# Patient Record
Sex: Female | Born: 1980 | Race: White | Hispanic: No | Marital: Married | Smoking: Former smoker
Health system: Southern US, Community
[De-identification: ages and names within clinical notes are randomized; demographics above are authoritative.]

## PROBLEM LIST (undated history)

## (undated) DIAGNOSIS — Q211 Atrial septal defect, unspecified: Secondary | ICD-10-CM

## (undated) DIAGNOSIS — F329 Major depressive disorder, single episode, unspecified: Secondary | ICD-10-CM

## (undated) DIAGNOSIS — F32A Depression, unspecified: Secondary | ICD-10-CM

## (undated) HISTORY — DX: Depression, unspecified: F32.A

## (undated) HISTORY — DX: Major depressive disorder, single episode, unspecified: F32.9

## (undated) HISTORY — PX: OTHER SURGICAL HISTORY: SHX169

## (undated) HISTORY — PX: ASD REPAIR: SHX258

---

## 2006-11-02 HISTORY — PX: KNEE ARTHROSCOPY: SUR90

## 2008-08-09 ENCOUNTER — Ambulatory Visit: Payer: Self-pay | Admitting: Family Medicine

## 2008-08-09 DIAGNOSIS — R5381 Other malaise: Secondary | ICD-10-CM | POA: Insufficient documentation

## 2008-08-09 DIAGNOSIS — J309 Allergic rhinitis, unspecified: Secondary | ICD-10-CM | POA: Insufficient documentation

## 2008-08-09 DIAGNOSIS — F329 Major depressive disorder, single episode, unspecified: Secondary | ICD-10-CM | POA: Insufficient documentation

## 2008-08-09 DIAGNOSIS — R5383 Other fatigue: Secondary | ICD-10-CM

## 2008-08-10 LAB — CONVERTED CEMR LAB
ALT: 15 units/L (ref 0–35)
Albumin: 4.3 g/dL (ref 3.5–5.2)
BUN: 14 mg/dL (ref 6–23)
Basophils Relative: 0.7 % (ref 0.0–3.0)
CO2: 29 meq/L (ref 19–32)
Calcium: 10 mg/dL (ref 8.4–10.5)
Cholesterol: 198 mg/dL (ref 0–200)
Creatinine, Ser: 1 mg/dL (ref 0.4–1.2)
GFR calc Af Amer: 84 mL/min
Glucose, Bld: 91 mg/dL (ref 70–99)
HCT: 37.3 % (ref 36.0–46.0)
Hemoglobin: 12.7 g/dL (ref 12.0–15.0)
Monocytes Absolute: 0.4 10*3/uL (ref 0.1–1.0)
Monocytes Relative: 6.4 % (ref 3.0–12.0)
Neutro Abs: 4.7 10*3/uL (ref 1.4–7.7)
RBC: 4.47 M/uL (ref 3.87–5.11)
RDW: 12.6 % (ref 11.5–14.6)
Sodium: 138 meq/L (ref 135–145)
TSH: 0.91 microintl units/mL (ref 0.35–5.50)
Total CHOL/HDL Ratio: 2.8
Total Protein: 6.6 g/dL (ref 6.0–8.3)
Triglycerides: 107 mg/dL (ref 0–149)

## 2009-03-31 LAB — CONVERTED CEMR LAB: Pap Smear: NORMAL

## 2009-09-04 ENCOUNTER — Telehealth: Payer: Self-pay | Admitting: Family Medicine

## 2010-02-24 ENCOUNTER — Telehealth: Payer: Self-pay | Admitting: Family Medicine

## 2010-03-28 ENCOUNTER — Ambulatory Visit: Payer: Self-pay | Admitting: Family Medicine

## 2010-03-28 DIAGNOSIS — K219 Gastro-esophageal reflux disease without esophagitis: Secondary | ICD-10-CM | POA: Insufficient documentation

## 2010-12-02 NOTE — Assessment & Plan Note (Signed)
Summary: MED REFILLS/RI   Vital Signs:  Patient profile:   30 year old female Height:      64.25 inches Weight:      158.25 pounds BMI:     27.05 Temp:     98.3 degrees F oral Pulse rate:   80 / minute Pulse rhythm:   regular BP sitting:   120 / 84  (left arm) Cuff size:   regular  Vitals Entered By: Lewanda Rife LPN (Mar 28, 2010 4:02 PM) CC: discuss meds   History of Present Illness: doing pretty good overall   no major medicine changes   went into a winter funk  some increase in stress at work  husband was out of work for a while -- is stressful winter is hard on her  better when she can be outdoors  now finally feeling better   is outdoors kayaking and walking dogs lots of exercise does go to gym in winter 3-4 d a week   still on ortho tri cyclen    wt is down 9 lb   is up to date on TD   had a little reflux -- took prilosec - as needed - helped tremendously  she does take aleve regularly for shoulder pain and sport injuries   Allergies: 1)  ! Paxil  Past History:  Past Medical History: Last updated: 08/09/2008 depression  allergic rhinitis  knee problems   gyn- Marlinda Mike (Midwife)- [facility]  ortho-- Dr L. 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  works at [employer]  G0P0 Married Former Smoker (quit at 20 -- did not smoke long)  Alcohol use-yes Drug use-no Regular exercise-yes-- elliptical and bike (4-5 times per week)  Risk Factors: Exercise: yes (08/09/2008)  Risk Factors: Smoking Status: quit (08/09/2008)  Social  History: Occupation: Architectural technologist Engineer, 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 Eyes:  Denies blurring and eye irritation. CV:  Denies chest pain or discomfort, palpitations, and shortness of breath with exertion. Resp:  Denies cough and wheezing. GI:  Complains of indigestion; denies abdominal pain, nausea, and vomiting. GU:  Denies urinary frequency. MS:  Denies joint pain and muscle aches. Derm:  Denies itching, lesion(s), and rash. Neuro:  Denies tingling. Psych:  Complains of depression; denies panic attacks, sense of great danger, and suicidal thoughts/plans. Endo:  Denies excessive thirst and excessive urination.  Physical Exam  General:  Well-developed,well-nourished,in no acute distress; alert,appropriate and cooperative throughout examination Head:  normocephalic, atraumatic, and no abnormalities observed.   Eyes:  vision grossly intact, pupils equal, pupils round, and pupils reactive to light.   Mouth:  pharynx pink and moist.   Neck:  supple with full rom and no masses or thyromegally, no JVD or carotid bruit  Chest Wall:  No deformities, masses, or tenderness noted. Lungs:  Normal respiratory effort, chest expands symmetrically.  Lungs are clear to auscultation, no crackles or wheezes. Heart:  Normal rate and regular rhythm. S1 and S2 normal without gallop, murmur, click, rub or other extra sounds. Abdomen:  Bowel sounds positive,abdomen soft and non-tender without masses, organomegaly or hernias noted. Extremities:  No clubbing, cyanosis, edema, or deformity noted with normal full range of motion of all joints.   Neurologic:  sensation intact to light touch, gait normal, and DTRs symmetrical and normal.   Skin:   Intact without suspicious lesions or rashes Cervical Nodes:  No lymphadenopathy noted Inguinal Nodes:  No significant adenopathy Psych:  normal affect, talkative and pleasant    Impression & Recommendations:  Problem # 1:  DEPRESSION (ICD-311) Assessment Improved doing well after winter funk -- getting back to herself  for now will continue currrent dose of wellbutrin and will call if she thinks she needs to increase  Her updated medication list for this problem includes:    Wellbutrin Xl 150 Mg Xr24h-tab (Bupropion hcl) ..... One by mouth daily  Problem # 2:  GERD (ICD-530.81) Assessment: New mild and intermittent  urged to continue prilosec as needed - (unless symptoms become more persistant)  also to avoid nsaids like aleve unless necessary  Complete Medication List: 1)  Wellbutrin Xl 150 Mg Xr24h-tab (Bupropion hcl) .... One by mouth daily 2)  Ortho Tri-cyclen (28) 0.035 Mg Tabs (Norgestimate-ethinyl estradiol) .... One by mouth daily as directed 3)  Claritin 10 Mg Tabs (Loratadine) .... One by mouth dialy 4)  Glucosamine Tabs  .... Daily as needed 5)  Aleve 220 Mg Tabs (Naproxen sodium) .... Otc as directed.  Patient Instructions: 1)  continue current dose of wellbutrin -- update me if your depression worsens and if you want to increase dose  2)  use aleve with caution - it causes stomach ulcers  3)  keep the great exercise  Prescriptions: WELLBUTRIN XL 150 MG XR24H-TAB (BUPROPION HCL) one by mouth daily  #90 x 3   Entered and Authorized by:   Judith Part MD   Signed by:   Judith Part MD on 03/28/2010   Method used:   Print then Give to Patient   RxID:   [id number]   Current Allergies (reviewed today): ! PAXIL   Preventive Care Screening  Pap Smear:    Date:  03/31/2009    Results:  normal

## 2010-12-02 NOTE — Progress Notes (Signed)
Summary: Bupropion 150mg  refill  Phone Note Refill Request Call back at [phone] Message from:  [facility] on February 24, 2010 9:54 AM  Refills Requested: Medication #1:  WELLBUTRIN XL 150 MG XR24H-TAB one by mouth daily   Last Refilled: 01/25/2010 [facility] electronically request refill for Bupropion 150mg . Pt has not been seen since 08/09/2008.Please advise.    Method Requested: Telephone to Pharmacy Initial call taken by: Lewanda Rife LPN,  February 24, 2010 9:55 AM  Follow-up for Phone Call        needs to f/u this summer- no visit since 2009  sched appt  then can refil -px written on EMR for call in  Follow-up by: Judith Part MD,  February 24, 2010 12:48 PM  Additional Follow-up for Phone Call Additional follow up Details #1::        Patient notified as instructed by telephone. Pt scheduled appt with Dr Milinda Antis 03/28/10 at 3:30pm as instructed. Medication phoned to [facility] as instructed. Lewanda Rife LPN  February 24, 2010 4:36 PM     New/Updated Medications: WELLBUTRIN XL 150 MG XR24H-TAB (BUPROPION HCL) one by mouth daily Prescriptions: WELLBUTRIN XL 150 MG XR24H-TAB (BUPROPION HCL) one by mouth daily  #30 x 2   Entered and Authorized by:   Judith Part MD   Signed by:   Lewanda Rife LPN on 45/40/9811   Method used:   Telephoned to ...         RxID:   [id number]

## 2011-05-05 ENCOUNTER — Other Ambulatory Visit: Payer: Self-pay

## 2011-05-05 MED ORDER — BUPROPION HCL ER (XL) 150 MG PO TB24: 150.0000 mg | ORAL_TABLET | Freq: Every day | ORAL | Status: DC

## 2011-05-05 NOTE — Telephone Encounter (Signed)
[facility] pharmacy faxed refill request for Bupropion HCL XL 150 mg take one tablet by mouth daily/ #90 x 0. Pt already scheduled for appt with Dr Milinda Antis 05/20/11.

## 2011-05-05 NOTE — Telephone Encounter (Signed)
First time printed. Second time normal was highlighted and printed again. Rene Kocher said to call in to pharmacy and document. I called to [facility] [phone].

## 2011-05-09 ENCOUNTER — Encounter: Payer: Self-pay | Admitting: Family Medicine

## 2011-05-20 ENCOUNTER — Encounter: Payer: Self-pay | Admitting: Family Medicine

## 2011-05-20 ENCOUNTER — Ambulatory Visit (INDEPENDENT_AMBULATORY_CARE_PROVIDER_SITE_OTHER): Payer: PRIVATE HEALTH INSURANCE | Admitting: Family Medicine

## 2011-05-20 VITALS — BP 110/68 | HR 76 | Temp 98.8°F | Ht 64.25 in | Wt 158.5 lb

## 2011-05-20 DIAGNOSIS — F411 Generalized anxiety disorder: Secondary | ICD-10-CM

## 2011-05-20 DIAGNOSIS — F419 Anxiety disorder, unspecified: Secondary | ICD-10-CM | POA: Insufficient documentation

## 2011-05-20 MED ORDER — SERTRALINE HCL 50 MG PO TABS: 50.0000 mg | ORAL_TABLET | Freq: Every day | ORAL | Status: DC

## 2011-05-20 MED ORDER — BUPROPION HCL ER (XL) 150 MG PO TB24: 150.0000 mg | ORAL_TABLET | Freq: Every day | ORAL | Status: DC

## 2011-05-20 NOTE — Assessment & Plan Note (Addendum)
Disc stressors /coping mech/ sympt and opt for tx in detail Added zoloft 25-titrate to 50  Disc poss side eff incl sedation (may help sleep) and also rare poss of worsening  Will update if any problems  Follow up 6-8 wk >25 min spent with face to face with patient, >50% counseling and/or coordinating care   Will wean wellbutrin if able and doing well on this since it tx both anx and depression

## 2011-05-20 NOTE — Patient Instructions (Signed)
Seek counseling at work  Keep exercising Add zoloft 1/2 tab in evening for 2 weeks then increase to 1 pill each evening  If worse or side effects let me know  Follow up in 6-8 weeks

## 2011-05-20 NOTE — Progress Notes (Signed)
Subjective:    Patient ID: Natalie Lowe, female    DOB: 1980/11/26, 30 y.o.   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 DEPRESSION  . ALLERGIC RHINITIS  . GERD  . FATIGUE  . Anxiety   Past Medical History  Diagnosis Date  . Depression   . Allergy     allergic rhinitis   Past Surgical History  Procedure Date  . Knee arthroscopy 2008    right  . Oates procedure 94-97    seven knee signs for torn ACL,menicusal tear   History  Substance Use Topics  . Smoking status: Former Smoker    Quit date: 11/02/2000  . Smokeless tobacco: Not on file  . Alcohol Use: Yes   Family History  Problem Relation Age of Onset  . Hypertension Mother   . Cancer Maternal Aunt     breast CA   Allergies  Allergen Reactions  . Paroxetine     REACTION: not effective   Current Outpatient Prescriptions on File Prior to Visit  Medication Sig Dispense Refill  . loratadine (CLARITIN)  10 MG tablet Take 10 mg by mouth daily.        Lorita Officer Triphasic (ORTHO TRI-CYCLEN, 28, PO) Take 1 tablet by mouth daily.        Marland Kitchen GLUCOSAMINE PO Take by mouth as needed.        . naproxen sodium (ALEVE) 220 MG tablet Take 220 mg by mouth as directed.              Review of Systems Review of Systems  Constitutional: Negative for fever, appetite change, fatigue and unexpected weight change.  Eyes: Negative for pain and visual disturbance.  Respiratory: Negative for cough and shortness of breath.   Cardiovascular: Negative.for cp or sob or palpitations   Gastrointestinal: Negative for nausea, diarrhea and constipation.  Genitourinary: Negative for urgency and frequency.  Skin: Negative for pallor.  Neurological: Negative for weakness, light-headedness, numbness and headaches.  Hematological: Negative for adenopathy. Does not bruise/bleed easily.  Psychiatric/Behavioral: more anxiety than dysphoric mood lately, no SI        Objective:   Physical Exam  Constitutional: She appears well-developed and well-nourished. No distress.  HENT:  Head: Normocephalic and atraumatic.  Left Ear: External ear normal.  Mouth/Throat: Oropharynx is clear and moist.  Eyes: Conjunctivae and EOM are normal. Pupils are equal, round, and reactive to light.  Neck: Normal range of motion. Neck supple. No JVD present. Carotid bruit is not present. No thyromegaly present.  Cardiovascular: Normal rate, regular rhythm, normal heart sounds and intact distal pulses.   Pulmonary/Chest: Effort normal and breath sounds normal. No respiratory distress. She has no wheezes.  Abdominal: Soft. Bowel sounds are normal.  Musculoskeletal: She exhibits no edema and no tenderness.  Lymphadenopathy:    She has no cervical adenopathy.  Neurological: She is alert. She has normal reflexes.       No tremor   Skin: Skin is warm and dry. No rash noted. No erythema. No pallor.  Psychiatric: She has a normal mood  and affect.       Seems mildly anxious - rapid speech at times No eye contact and comm skills Pleasant overall           Assessment & Plan:

## 2011-07-15 ENCOUNTER — Ambulatory Visit (INDEPENDENT_AMBULATORY_CARE_PROVIDER_SITE_OTHER): Payer: PRIVATE HEALTH INSURANCE | Admitting: Family Medicine

## 2011-07-15 ENCOUNTER — Encounter: Payer: Self-pay | Admitting: Family Medicine

## 2011-07-15 DIAGNOSIS — F411 Generalized anxiety disorder: Secondary | ICD-10-CM

## 2011-07-15 DIAGNOSIS — F419 Anxiety disorder, unspecified: Secondary | ICD-10-CM

## 2011-07-15 DIAGNOSIS — F3289 Other specified depressive episodes: Secondary | ICD-10-CM

## 2011-07-15 DIAGNOSIS — F329 Major depressive disorder, single episode, unspecified: Secondary | ICD-10-CM

## 2011-07-15 NOTE — Progress Notes (Signed)
Subjective:    Patient ID: Natalie Lowe, female    DOB: Mar 14, 1981, 30 y.o.   MRN: [id number]  HPI Here for f/u of anxiety  Last visit added zoloft to her wellbutrin  Is doing so much better - even her husband has noticed Thinks about stress but it does not eat at her  More laid back - not ahead of herself  Able to feel more joy and laughs more   Does feel a little more tired - not too bad though -- was worse initially Does not make a difference what time she takes it  Is sleeping much better too  Does not wake up  Appetite the same   Wants to stay on zoloft and get off of wellbutrin (does not think she needs both)  Patient Active Problem List  Diagnoses  . DEPRESSION  . ALLERGIC RHINITIS  . GERD  . Anxiety   Past Medical History  Diagnosis Date  . Depression   . Allergy     allergic rhinitis   Past Surgical History  Procedure Date  . Knee arthroscopy 2008    right  . Oates procedure 94-97    seven knee signs for torn ACL,menicusal tear   History  Substance Use Topics  . Smoking status: Former Smoker    Quit date: 11/02/2000  . Smokeless tobacco: Not on file  . Alcohol Use: Yes   Family History  Problem Relation Age of Onset  . Hypertension Mother   . Cancer Maternal Aunt     breast CA   Allergies  Allergen Reactions  . Paroxetine     REACTION: not effective   Current Outpatient Prescriptions on File Prior to Visit  Medication Sig Dispense Refill  . Norgestim-Eth Estrad Triphasic (ORTHO TRI-CYCLEN, 28, PO) Take 1 tablet by mouth daily.        Marland Kitchen omeprazole (PRILOSEC OTC) 20 MG tablet Take 20 mg by mouth daily.        . sertraline (ZOLOFT) 50 MG tablet Take 1 tablet (50 mg total) by mouth daily.  90 tablet  3  . GLUCOSAMINE PO Take by mouth as needed.        Marland Kitchen ibuprofen (ADVIL,MOTRIN) 200 MG tablet Take 200 mg by mouth every 6 (six) hours as needed.        . loratadine (CLARITIN) 10 MG tablet Take 10 mg by mouth daily.        . naproxen sodium (ALEVE)  220 MG tablet Take 220 mg by mouth as directed.           Review of Systems Review of Systems  Constitutional: Negative for fever, appetite change, fatigue and unexpected weight change.  Eyes: Negative for pain and visual disturbance.  Respiratory: Negative for cough and shortness of breath.   Cardiovascular: Negative for cp or palpitations    Gastrointestinal: Negative for nausea, diarrhea and constipation.  Genitourinary: Negative for urgency and frequency.  Skin: Negative for pallor or rash   Neurological: Negative for weakness, light-headedness, numbness and headaches.  Hematological: Negative for adenopathy. Does not bruise/bleed easily.  Psychiatric/Behavioral: Negative for dysphoric mood. The patient is much less anxious, and no SI          Objective:   Physical Exam  Constitutional: She appears well-developed and well-nourished. No distress.  HENT:  Head: Normocephalic and atraumatic.  Mouth/Throat: Oropharynx is clear and moist.  Eyes: Conjunctivae and EOM are normal. Pupils are equal, round, and reactive to light.  Neck: Normal range of  motion. Neck supple. No JVD present. No thyromegaly present.  Cardiovascular: Normal rate, regular rhythm, normal heart sounds and intact distal pulses.   Pulmonary/Chest: Effort normal and breath sounds normal. No respiratory distress. She has no wheezes.  Lymphadenopathy:    She has no cervical adenopathy.  Neurological: She is alert. She has normal reflexes. No cranial nerve deficit. Coordination normal.       No tremor   Skin: Skin is warm and dry. No pallor.  Psychiatric:       Relaxed affect, no fast or pressured speech Animated and happy appearing  Good eye contact and communication skills           Assessment & Plan:

## 2011-07-15 NOTE — Assessment & Plan Note (Signed)
This is much improved with zoloft so wants to continue it  For now - will try getting off the wellbutrin - and re start if any problems or rebound symptoms Pt no longer thinks that depression is the issue Disc coping skills F/u 6 mo

## 2011-07-15 NOTE — Assessment & Plan Note (Signed)
Overall doing much better with zoloft and control of anx so will stop the wellbutrin Pt aware to call if any problems or symptoms return

## 2011-07-15 NOTE — Patient Instructions (Signed)
Continue the zoloft  Stop the wellbutrin  If you feel bad/ depressed/worse- call and let me know  Keep up good health habits and try to exercise  Follow up in about 6 months

## 2011-11-10 ENCOUNTER — Telehealth: Payer: Self-pay | Admitting: Internal Medicine

## 2011-11-10 NOTE — Telephone Encounter (Signed)
Have her f/u with me please for heart M and also palpitations- thanks

## 2011-11-10 NOTE — Telephone Encounter (Signed)
Left vm for pt to callback 

## 2011-11-10 NOTE — Telephone Encounter (Signed)
Patient notified as instructed by telephone. Pt has already made appt for 11/18/11 at 4pm. Pt will call back if symptoms worsen.

## 2011-11-10 NOTE — Telephone Encounter (Signed)
PA at work diagnosed her with a heart murmur and she is concerned about it because her mom had a heart murmur and come to find out she had a congential defect that required surgery and she didn't know if you could refer her to someone or she needed to come and see you first.  Over the past 1 year she has had heart palpitations and it lasted 5 to 10 minutes.  But she exercises regularly and none recently she is just concerned with her family history.  Please advise.

## 2011-11-18 ENCOUNTER — Ambulatory Visit (INDEPENDENT_AMBULATORY_CARE_PROVIDER_SITE_OTHER): Payer: PRIVATE HEALTH INSURANCE | Admitting: Family Medicine

## 2011-11-18 ENCOUNTER — Encounter: Payer: Self-pay | Admitting: Family Medicine

## 2011-11-18 VITALS — BP 108/70 | HR 72 | Temp 98.4°F | Ht 64.25 in | Wt 166.0 lb

## 2011-11-18 DIAGNOSIS — F419 Anxiety disorder, unspecified: Secondary | ICD-10-CM

## 2011-11-18 DIAGNOSIS — R002 Palpitations: Secondary | ICD-10-CM

## 2011-11-18 DIAGNOSIS — F411 Generalized anxiety disorder: Secondary | ICD-10-CM

## 2011-11-18 DIAGNOSIS — Z8249 Family history of ischemic heart disease and other diseases of the circulatory system: Secondary | ICD-10-CM | POA: Insufficient documentation

## 2011-11-18 DIAGNOSIS — R011 Cardiac murmur, unspecified: Secondary | ICD-10-CM | POA: Insufficient documentation

## 2011-11-18 MED ORDER — FLUOXETINE HCL 20 MG PO TABS: 20.0000 mg | ORAL_TABLET | Freq: Every day | ORAL | Status: DC

## 2011-11-18 NOTE — Progress Notes (Signed)
Subjective:    Patient ID: Natalie Lowe, female    DOB: 08/20/1981, 31 y.o.   MRN: [id number]  HPI Here for f/u of palpitations and possible heart murmur and also question about wt gain rel to anx med  A PA at work - employee clinic and told she had a M   (recommended baseline echo)  Once an orthopedic doc told her the same thing   Has palpitations -- occ / this past year 3 times (last 4-5 months )- lasted 5-20 minutes max  Infrequent irregular beats, NOT a racing heart  No dizziness or syncope Works out 3-4 days per week     Pt's mother had a congenital heart defect that required surgery Thinks she had an atrial septal defect - had surgery in her mid 48s (after passing out )  Patent foramen ovale? Possible    EKG today RRR 72, no acute changes  Has RSR in V1  occ a bit anemic - donates blood  Hb over 12 in dec   Has put on 10 lb recently -- zoloft for few months Wants to try prozac instead   Patient Active Problem List  Diagnoses  . DEPRESSION  . ALLERGIC RHINITIS  . GERD  . Anxiety  . Heart murmur  . Family history of heart murmur   Past Medical History  Diagnosis Date  . Depression   . Allergy     allergic rhinitis   Past Surgical History  Procedure Date  . Knee arthroscopy 2008    right  . Oates procedure 94-97    seven knee signs for torn ACL,menicusal tear   History  Substance Use Topics  . Smoking status: Former Smoker    Quit date: 11/02/2000  . Smokeless tobacco: Not on file  . Alcohol Use: Yes   Family History  Problem Relation Age of Onset  . Hypertension Mother   . Cancer Maternal Aunt     breast CA   Allergies  Allergen Reactions  . Paroxetine     REACTION: not effective   Current Outpatient Prescriptions on File Prior to Visit  Medication Sig Dispense Refill  . fluticasone (FLONASE) 50 MCG/ACT nasal spray Place 2 sprays into the nose daily.        Marland Kitchen ibuprofen (ADVIL,MOTRIN) 200 MG tablet Take 200 mg by mouth every 6 (six) hours as  needed.        . loratadine (CLARITIN) 10 MG tablet Take 10 mg by mouth daily.        Lorita Officer Triphasic (ORTHO TRI-CYCLEN, 28, PO) Take 1 tablet by mouth daily.        Marland Kitchen omeprazole (PRILOSEC OTC) 20 MG tablet Take 20 mg by mouth daily.        Marland Kitchen Fexofenadine HCl (ALLEGRA PO) Take by mouth as directed.        Marland Kitchen GLUCOSAMINE PO Take by mouth as needed.        . naproxen sodium (ALEVE) 220 MG tablet Take 220 mg by mouth as directed.             Review of Systems Review of Systems  Constitutional: Negative for fever, appetite change, fatigue and pos for wt gain Eyes: Negative for pain and visual disturbance.  Respiratory: Negative for cough and shortness of breath.   Cardiovascular: Negative for cp or sob on exertion or edema, pos for occ palpitations  Gastrointestinal: Negative for nausea, diarrhea and constipation.  Genitourinary: Negative for urgency and frequency.  Skin: Negative  for pallor or rash   Neurological: Negative for weakness, light-headedness, numbness and headaches.  Hematological: Negative for adenopathy. Does not bruise/bleed easily.  Psychiatric/Behavioral: Negative for dysphoric mood. The patient's anxiety is in good control.          Objective:   Physical Exam  Constitutional: She appears well-developed and well-nourished. No distress.  HENT:  Head: Normocephalic and atraumatic.  Mouth/Throat: Oropharynx is clear and moist.  Eyes: Conjunctivae and EOM are normal. Pupils are equal, round, and reactive to light. No scleral icterus.  Neck: Normal range of motion. Neck supple. No JVD present. Carotid bruit is not present. No thyromegaly present.  Cardiovascular: Normal rate, regular rhythm and intact distal pulses.  Exam reveals no gallop and no friction rub.   Murmur heard.      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 She exhibits no edema.  Lymphadenopathy:    She has no cervical adenopathy.    Neurological: She is alert. She has normal reflexes. She displays no tremor. No cranial nerve deficit. She exhibits normal muscle tone. Coordination normal.  Skin: Skin is warm and dry. No rash noted. No erythema. No pallor.  Psychiatric: She has a normal mood and affect.       Cheerful and talkative          Assessment & Plan:

## 2011-11-18 NOTE — Assessment & Plan Note (Signed)
Systolic M heard best at LSB  occ palpitations but no other symptoms at all- even during athletics Adv to stop caffeine Scheduled 2D echo

## 2011-11-18 NOTE — Assessment & Plan Note (Signed)
Doing well but gaining wt on zoloft Will change to prozac and update Rev poss side eff - if worse will call or if not as effective

## 2011-11-18 NOTE — Patient Instructions (Signed)
We will set you up for echocardiogram at check out  Avoid caffeine as much as possible  Will update you with results  Change zoloft to prozac - update me if any problems

## 2011-11-18 NOTE — Assessment & Plan Note (Signed)
Mother had ? 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

## 2011-11-24 ENCOUNTER — Ambulatory Visit: Payer: Self-pay | Admitting: Family Medicine

## 2011-11-24 DIAGNOSIS — I059 Rheumatic mitral valve disease, unspecified: Secondary | ICD-10-CM

## 2011-12-03 ENCOUNTER — Ambulatory Visit: Payer: Self-pay | Admitting: Cardiovascular Disease

## 2011-12-03 DIAGNOSIS — I517 Cardiomegaly: Secondary | ICD-10-CM

## 2011-12-04 ENCOUNTER — Telehealth: Payer: Self-pay | Admitting: *Deleted

## 2011-12-04 NOTE — Telephone Encounter (Signed)
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 She understands recovery instructions, but wants to know what she can do at the gym, any limitations to getting her HR up, etc. She was told Dr. Windell Hummingbird office would call with detailed instructions for this. I told her to take it easy over weekend and will send msg to Dr. Mariah Milling.

## 2011-12-04 NOTE — Telephone Encounter (Signed)
Spoke to pt, notified per Dr. Mariah Milling that she is scheduled for appt with Dr. Excell Seltzer in [facility] for Feb 7 for consult for ASD closure and she knows pictures will be sent there and also will have pics here in our office next week if she wants to pick up.

## 2011-12-10 ENCOUNTER — Ambulatory Visit (INDEPENDENT_AMBULATORY_CARE_PROVIDER_SITE_OTHER): Payer: PRIVATE HEALTH INSURANCE | Admitting: Cardiovascular Disease

## 2011-12-10 ENCOUNTER — Other Ambulatory Visit: Payer: Self-pay | Admitting: Cardiovascular Disease

## 2011-12-10 ENCOUNTER — Encounter: Payer: Self-pay | Admitting: Cardiovascular Disease

## 2011-12-10 VITALS — BP 106/54 | HR 75 | Ht 64.0 in | Wt 160.0 lb

## 2011-12-10 DIAGNOSIS — Q211 Atrial septal defect, unspecified: Secondary | ICD-10-CM | POA: Insufficient documentation

## 2011-12-10 MED ORDER — CLOPIDOGREL BISULFATE 75 MG PO TABS: 75.0000 mg | ORAL_TABLET | Freq: Every day | ORAL | Status: DC

## 2011-12-10 NOTE — Progress Notes (Signed)
HPI:  Natalie Lowe is a delightful 31-year-old woman presenting for evaluation of ASD. The patient was diagnosed with a heart murmur and underwent a screening echocardiogram. This demonstrated findings consistent with an atrial septal defect and right heart enlargement. She underwent a transesophageal echo by Dr. Gollan and this confirmed a fairly large secundum ASD. In some views there appears to be continuity of tissue but in other views there is clear discontinuity of tissue in the atrial septum.  The patient complains of shortness of breath, sometimes at rest and sometimes with exertion. She has no orthopnea, PND, or edema. She has rare palpitations. She has no chest pain.  The patient has no history of stroke or TIA.  Her family history is significant in that her mother had an ASD and underwent surgical closure.  Outpatient Encounter Prescriptions as of 12/10/2011  Medication Sig Dispense Refill  . aspirin 81 MG tablet Take 160 mg by mouth daily.      . FLUoxetine (PROZAC) 20 MG tablet Take 1 tablet (20 mg total) by mouth daily.  30 tablet  11  . fluticasone (FLONASE) 50 MCG/ACT nasal spray Place 2 sprays into the nose daily.        . GLUCOSAMINE PO Take by mouth as needed.        . ibuprofen (ADVIL,MOTRIN) 200 MG tablet Take 200 mg by mouth every 6 (six) hours as needed.        . loratadine (CLARITIN) 10 MG tablet Take 10 mg by mouth daily.        . naproxen sodium (ALEVE) 220 MG tablet Take 220 mg by mouth as directed.        . omeprazole (PRILOSEC OTC) 20 MG tablet Take 20 mg by mouth daily.        . progesterone (PROMETRIUM) 100 MG capsule Take 100 mg by mouth daily.      . clopidogrel (PLAVIX) 75 MG tablet Take 1 tablet (75 mg total) by mouth daily.  30 tablet  11  . DISCONTD: Fexofenadine HCl (ALLEGRA PO) Take by mouth as directed.        . DISCONTD: Norgestim-Eth Estrad Triphasic (ORTHO TRI-CYCLEN, 28, PO) Take 1 tablet by mouth daily.          Paroxetine  Past Medical History    Diagnosis Date  . Depression   . Allergy     allergic rhinitis    Past Surgical History  Procedure Date  . Knee arthroscopy 2008    right  . Oates procedure 94-97    seven knee signs for torn ACL,menicusal tear    History   Social History  . Marital Status: Married    Spouse Name: N/A    Number of Children: N/A  . Years of Education: N/A   Occupational History  . Not on file.   Social History Main Topics  . Smoking status: Former Smoker    Quit date: 11/02/2000  . Smokeless tobacco: Not on file  . Alcohol Use: Yes  . Drug Use: No  . Sexually Active:    Other Topics Concern  . Not on file   Social History Narrative  . No narrative on file    Family History  Problem Relation Age of Onset  . Hypertension Mother   . Cancer Maternal Aunt     breast CA    ROS: General: no fevers/chills/night sweats Eyes: no blurry vision, diplopia, or amaurosis ENT: no sore throat or hearing loss Resp: no cough, wheezing, or   hemoptysis CV: no edema or palpitations GI: no abdominal pain, nausea, vomiting, diarrhea, or constipation GU: no dysuria, frequency, or hematuria Skin: no rash Neuro: no headache, numbness, tingling, or weakness of extremities Musculoskeletal: no joint pain or swelling Heme: no bleeding, DVT, or easy bruising Endo: no polydipsia or polyuria  BP 106/54  Pulse 75  Ht 5' 4" (1.626 m)  Wt 72.576 kg (160 lb)  BMI 27.46 kg/m2  PHYSICAL EXAM: Pt is alert and oriented, WD, WN, in no distress. HEENT: normal Neck: JVP normal. Carotid upstrokes normal without bruits. No thyromegaly. Lungs: equal expansion, clear bilaterally CV: Apex is discrete and nondisplaced, RRR with grade 2/6 systolic murmur at the left sternal border Abd: soft, NT, +BS, no bruit, no hepatosplenomegaly Back: no CVA tenderness Ext: no C/C/E        Femoral pulses 2+= without bruits        DP/PT pulses intact and = Skin: warm and dry without rash Neuro: CNII-XII intact              Strength intact = bilaterally  EKG:  Normal sinus rhythm with an RSR prime pattern in V1.  ASSESSMENT AND PLAN:  

## 2011-12-10 NOTE — Assessment & Plan Note (Signed)
I have reviewed the patient's 2-D echo and TEE images. She has a large secundum ASD with left to right shunt and mild dilatation of the right atrium and right ventricle. She is symptomatic with shortness of breath. I think she meets clear indication for closure of her defect. We reviewed the potential treatment options of surgical closure versus transcatheter closure. I think her defect is anatomically suitable for transcatheter closure. We discussed the risks, alternatives, and indication for the procedure. Risks include but are not limited to bleeding, infection, stroke, myocardial infarction, device embolization, myocardial perforation, Tamponade, emergency surgery, and death. We also discussed the late risk of device erosion. She understands these risks are low, occurring approximately 1% of the time.  The patient and her husband asked appropriate questions, and would like to move forward with the procedure. We will start her on Plavix which she should continue for at least 3 months after device closure. She understands that she will have to use SBE prophylaxis for 6 months.

## 2011-12-10 NOTE — Patient Instructions (Addendum)
Dr Excell Seltzer has recommended ASD closure.   Start Plavix 75mg  on 12/23/11 take one by mouth daily.

## 2011-12-14 ENCOUNTER — Other Ambulatory Visit: Payer: Self-pay | Admitting: Physician Assistant

## 2011-12-16 ENCOUNTER — Encounter (HOSPITAL_COMMUNITY): Payer: Self-pay | Admitting: Pharmacy Technician

## 2011-12-24 ENCOUNTER — Telehealth: Payer: Self-pay | Admitting: Cardiovascular Disease

## 2011-12-24 NOTE — Telephone Encounter (Signed)
New msg Pt wants to know about pre authorization for procedure next week Please call

## 2011-12-24 NOTE — Telephone Encounter (Signed)
Spoke with pt, according to charmaine the pre-cert person in our office, she is waiting for pre-determination call back. The pt also wants to make sure dr cooper got her FMLA paperwork. Will have Lauren call and confirm with the pt regarding paperwork.

## 2011-12-25 NOTE — Telephone Encounter (Signed)
Pt aware of change in arrival time for procedure. I made the pt aware that we did receive her FMLA forms.  The pt said these need to be faxed back prior to 12/31/11.  I will make Dr Excell Seltzer aware.

## 2011-12-25 NOTE — Telephone Encounter (Signed)
Follow up on previous call;  Patient calling back to speak with nurse.

## 2011-12-25 NOTE — Telephone Encounter (Signed)
The time for pt's ASD closure has been changed to 1:30.  The pt should arrive at 11:30.  The pt can have clear liquids until 7:30 am and then NPO.  FMLA papers are in MD folder for completion.  I left a message for the pt to call back.

## 2011-12-28 NOTE — Telephone Encounter (Signed)
Form filled out and placed in  folder.

## 2011-12-30 ENCOUNTER — Telehealth: Payer: Self-pay | Admitting: Cardiovascular Disease

## 2011-12-30 NOTE — Telephone Encounter (Signed)
Pt calling re question she has about procedure she's having tomorrow, pls call

## 2011-12-30 NOTE — Telephone Encounter (Signed)
Pt reminded of report time for procedure.

## 2011-12-31 ENCOUNTER — Ambulatory Visit (HOSPITAL_COMMUNITY)
Admission: RE | Admit: 2011-12-31 | Discharge: 2012-01-01 | Disposition: A | Discharge status: Home / Self Care | Payer: PRIVATE HEALTH INSURANCE | Source: Ambulatory Visit | Attending: Cardiovascular Disease | Admitting: Cardiovascular Disease

## 2011-12-31 ENCOUNTER — Encounter (HOSPITAL_COMMUNITY)
Admission: RE | Disposition: A | Discharge status: Home / Self Care | Payer: Self-pay | Source: Ambulatory Visit | Attending: Cardiovascular Disease

## 2011-12-31 DIAGNOSIS — F329 Major depressive disorder, single episode, unspecified: Secondary | ICD-10-CM | POA: Insufficient documentation

## 2011-12-31 DIAGNOSIS — Q211 Atrial septal defect, unspecified: Secondary | ICD-10-CM | POA: Insufficient documentation

## 2011-12-31 DIAGNOSIS — Q2111 Secundum atrial septal defect: Secondary | ICD-10-CM | POA: Insufficient documentation

## 2011-12-31 DIAGNOSIS — F3289 Other specified depressive episodes: Secondary | ICD-10-CM | POA: Insufficient documentation

## 2011-12-31 DIAGNOSIS — J309 Allergic rhinitis, unspecified: Secondary | ICD-10-CM | POA: Insufficient documentation

## 2011-12-31 DIAGNOSIS — R0602 Shortness of breath: Secondary | ICD-10-CM | POA: Insufficient documentation

## 2011-12-31 HISTORY — DX: Atrial septal defect: Q21.1

## 2011-12-31 HISTORY — PX: PATENT FORAMEN OVALE CLOSURE: SHX5483

## 2011-12-31 HISTORY — DX: Atrial septal defect, unspecified: Q21.10

## 2011-12-31 LAB — PROTIME-INR
INR: 0.96 (ref 0.00–1.49)
Prothrombin Time: 13 seconds (ref 11.6–15.2)

## 2011-12-31 LAB — BASIC METABOLIC PANEL
Chloride: 106 mEq/L (ref 96–112)
GFR calc Af Amer: >90 mL/min (ref >90)
GFR calc non Af Amer: >90 mL/min (ref >90)
Potassium: 4.4 mEq/L (ref 3.5–5.1)
Sodium: 141 mEq/L (ref 135–145)

## 2011-12-31 LAB — APTT: aPTT: 28 seconds (ref 24–37)

## 2011-12-31 LAB — CBC
Platelets: 244 10*3/uL (ref 150–400)
RBC: 4.53 MIL/uL (ref 3.87–5.11)
RDW: 14.1 % (ref 11.5–15.5)
WBC: 4.8 10*3/uL (ref 4.0–10.5)

## 2011-12-31 LAB — HCG, SERUM, QUALITATIVE: Preg, Serum: NEGATIVE

## 2011-12-31 LAB — POCT ACTIVATED CLOTTING TIME: Activated Clotting Time: 243 seconds

## 2011-12-31 SURGERY — PATENT FORAMEN OVALE CLOSURE
Anesthesia: LOCAL

## 2011-12-31 MED ORDER — NORETHINDRONE 0.35 MG PO TABS: 1.0000 | ORAL_TABLET | Freq: Every day | ORAL | Status: DC

## 2011-12-31 MED ORDER — MIDAZOLAM HCL 2 MG/2ML IJ SOLN
INTRAMUSCULAR | Status: AC
  Filled 2011-12-31: qty 2

## 2011-12-31 MED ORDER — DIAZEPAM 5 MG PO TABS: 5.0000 mg | ORAL_TABLET | ORAL | Status: DC

## 2011-12-31 MED ORDER — SODIUM CHLORIDE 0.9 % IV SOLN: INTRAVENOUS | Status: DC

## 2011-12-31 MED ORDER — FENTANYL CITRATE 0.05 MG/ML IJ SOLN
INTRAMUSCULAR | Status: AC
  Filled 2011-12-31: qty 2

## 2011-12-31 MED ORDER — ASPIRIN 81 MG PO CHEW
CHEWABLE_TABLET | ORAL | Status: AC
  Filled 2011-12-31: qty 4

## 2011-12-31 MED ORDER — HEPARIN SODIUM (PORCINE) 1000 UNIT/ML IJ SOLN
INTRAMUSCULAR | Status: AC
  Filled 2011-12-31: qty 1

## 2011-12-31 MED ORDER — SODIUM CHLORIDE 0.9 % IJ SOLN: 3.0000 mL | INTRAMUSCULAR | Status: DC | PRN

## 2011-12-31 MED ORDER — ASPIRIN 81 MG PO CHEW
324.0000 mg | CHEWABLE_TABLET | ORAL | Status: AC
  Administered 2011-12-31: 324 mg via ORAL

## 2011-12-31 MED ORDER — FLUOXETINE HCL 20 MG PO CAPS
20.0000 mg | ORAL_CAPSULE | Freq: Every day | ORAL | Status: DC
  Administered 2012-01-01: 20 mg via ORAL
  Filled 2011-12-31 (×2): qty 1

## 2011-12-31 MED ORDER — SODIUM CHLORIDE 0.9 % IJ SOLN: 3.0000 mL | Freq: Two times a day (BID) | INTRAMUSCULAR | Status: DC

## 2011-12-31 MED ORDER — PANTOPRAZOLE SODIUM 40 MG PO TBEC
40.0000 mg | DELAYED_RELEASE_TABLET | Freq: Every day | ORAL | Status: DC
  Administered 2012-01-01: 40 mg via ORAL
  Filled 2011-12-31: qty 1

## 2011-12-31 MED ORDER — CEFAZOLIN SODIUM 1-5 GM-% IV SOLN: 1.0000 g | Freq: Once | INTRAVENOUS | Status: DC

## 2011-12-31 MED ORDER — FLUTICASONE PROPIONATE 50 MCG/ACT NA SUSP
2.0000 | Freq: Every day | NASAL | Status: DC
  Filled 2011-12-31: qty 16

## 2011-12-31 MED ORDER — SODIUM CHLORIDE 0.9 % IV SOLN
250.0000 mL | INTRAVENOUS | Status: DC | PRN
  Administered 2011-12-31: 100 mL via INTRAVENOUS

## 2011-12-31 MED ORDER — FLUOXETINE HCL 20 MG PO TABS: 20.0000 mg | ORAL_TABLET | Freq: Every day | ORAL | Status: DC

## 2011-12-31 MED ORDER — OMEPRAZOLE MAGNESIUM 20 MG PO TBEC: 20.0000 mg | DELAYED_RELEASE_TABLET | Freq: Every day | ORAL | Status: DC

## 2011-12-31 MED ORDER — SODIUM CHLORIDE 0.9 % IV SOLN: 250.0000 mL | INTRAVENOUS | Status: DC

## 2011-12-31 MED ORDER — CLOPIDOGREL BISULFATE 75 MG PO TABS: 75.0000 mg | ORAL_TABLET | ORAL | Status: DC

## 2011-12-31 MED ORDER — ONDANSETRON HCL 4 MG/2ML IJ SOLN: 4.0000 mg | Freq: Four times a day (QID) | INTRAMUSCULAR | Status: DC | PRN

## 2011-12-31 MED ORDER — LIDOCAINE HCL (PF) 1 % IJ SOLN
INTRAMUSCULAR | Status: AC
  Filled 2011-12-31: qty 30

## 2011-12-31 MED ORDER — HEPARIN (PORCINE) IN NACL 2-0.9 UNIT/ML-% IJ SOLN
INTRAMUSCULAR | Status: AC
  Filled 2011-12-31: qty 2000

## 2011-12-31 MED ORDER — SODIUM CHLORIDE 0.9 % IJ SOLN
3.0000 mL | Freq: Two times a day (BID) | INTRAMUSCULAR | Status: DC
  Administered 2011-12-31 – 2012-01-01 (×2): 3 mL via INTRAVENOUS

## 2011-12-31 MED ORDER — ASPIRIN 81 MG PO CHEW
81.0000 mg | CHEWABLE_TABLET | Freq: Every day | ORAL | Status: DC
  Administered 2012-01-01: 81 mg via ORAL
  Filled 2011-12-31: qty 1

## 2011-12-31 MED ORDER — SODIUM CHLORIDE 0.9 % IV SOLN
1.0000 mL/kg/h | INTRAVENOUS | Status: AC
  Administered 2011-12-31: 72 mL via INTRAVENOUS

## 2011-12-31 MED ORDER — DIAZEPAM 5 MG PO TABS
ORAL_TABLET | ORAL | Status: AC
  Administered 2011-12-31: 5 mg
  Filled 2011-12-31: qty 1

## 2011-12-31 MED ORDER — LORATADINE 10 MG PO TABS
10.0000 mg | ORAL_TABLET | Freq: Every day | ORAL | Status: DC
  Administered 2012-01-01: 10 mg via ORAL
  Filled 2011-12-31 (×2): qty 1

## 2011-12-31 MED ORDER — CEFAZOLIN SODIUM 1-5 GM-% IV SOLN
INTRAVENOUS | Status: AC
  Filled 2011-12-31: qty 50

## 2011-12-31 MED ORDER — IBUPROFEN 600 MG PO TABS
600.0000 mg | ORAL_TABLET | Freq: Two times a day (BID) | ORAL | Status: DC | PRN
  Filled 2011-12-31: qty 1

## 2011-12-31 MED ORDER — CLOPIDOGREL BISULFATE 75 MG PO TABS
75.0000 mg | ORAL_TABLET | Freq: Every day | ORAL | Status: DC
  Administered 2012-01-01: 75 mg via ORAL
  Filled 2011-12-31: qty 1

## 2011-12-31 MED ORDER — ASPIRIN 81 MG PO TABS: 81.0000 mg | ORAL_TABLET | Freq: Every day | ORAL | Status: DC

## 2011-12-31 MED ORDER — ACETAMINOPHEN 325 MG PO TABS
650.0000 mg | ORAL_TABLET | ORAL | Status: DC | PRN
  Administered 2012-01-01 (×2): 650 mg via ORAL
  Filled 2011-12-31 (×2): qty 2

## 2011-12-31 NOTE — Progress Notes (Signed)
Groin Site: Right Groin  Site Prior to Removal: Level I with oozing onto gauze  Sheath Removal Time: 20:33  Pressure held for: 15 minutes  Pressure dressing applied: Yes  Manual: Yes  Patient status during sheath removal: Developed vagal response in final two minutes of hold, HR decreased to 45, BP decreased to 89/38.  Patient complained of feeling "hot", some nausea, cool washcloth applied to forehead, 250 cc NS bolus given.  Symptoms resolved within five minutes.  Pressure Dressing applied: Yes  Post sheath removal instructions given: Yes

## 2011-12-31 NOTE — Progress Notes (Signed)
ACT=144 per I-STAT

## 2011-12-31 NOTE — Interval H&P Note (Signed)
History and Physical Interval Note:  12/31/2011 3:47 PM  Natalie Lowe  has presented today for surgery, with the diagnosis of ASD  The various methods of treatment have been discussed with the patient and family. After consideration of risks, benefits and other options for treatment, the patient has consented to  Procedure(s) (LRB): ASD CLOSURE (N/A) as a surgical intervention .  The patients' history has been reviewed, patient examined, no change in status, stable for surgery.  I have reviewed the patients' chart and labs.  Questions were answered to the patient's satisfaction.     Tonny Bollman

## 2011-12-31 NOTE — CV Procedure (Signed)
   Cardiac Catheterization Procedure Note  Name: Ellicia Alix MRN: [id number] DOB: 08/21/1981  Procedure: Intracardiac echo, transcatheter ASD closure.  Indication: Large secundum ASD with right heart dilatation and shortness of breath.   Procedural details: The right groin was prepped, draped, and anesthetized with 1% lidocaine. Using modified Seldinger technique, an 8 French sheath was placed in the right femoral vein and a 9 French sheath was placed just inferior to the initial sheath also in the right femoral vein. After venous access was obtained, the patient was given weight-based unfractionated heparin. An intracardiac echo probe was advanced into the right atrium and extensive imaging was performed. This demonstrated an ostium secundum atrial septal defect, predominantly located near the superior vena cava. A multipurpose catheter was advanced into the left lower pulmonary vein with a Wholey wire. This was changed out for a stiff J-tip Amplatz wire. The sheath was removed and a 24 mm sizing balloon was advanced across the interatrial septum. Under guidance of intracardiac echo, the alone was inflated until stop-flow was achieved.  The ASD was sized based on both fluoroscopic measurements and measurements under intracardiac echo.  The defect sized between 23 and 24 mm and a 24 mm Amplatzer septal occluder device was chosen. The device was prepped under normal technique. The sizing balloon was removed and a 10 French delivery sheath was advanced into the origin of the left upper pulmonary vein. The device was positioned in the left atrial disc was deployed. The device was pulled back and the right atrial disc was deployed. The device initially looked to be in good position but it jumped back into the right atrium. The device was removed and ASD was recrossed with a wire and the delivery sheath was again repositioned. The device was deployed 2 more times and ultimately it appeared to have good tissue  capture both of the secundum and the primum septum. Extensive intracardiac echo imaging and fluoroscopic imaging was done in order to confirm appropriate device position. The device was released and the heart was reimaged. This demonstrated excellent device position. There was no residual color flow across the atrial septum. The patient tolerated the entire procedure well. She was given multiple rounds of heparin to keep an ACT greater than 2/102. The long delivery sheath was changed out for a short sheath. The entire procedure was well tolerated. There were no apparent immediate complications.  Final Conclusions:  Successful closure of a 24 mm ostium secundum atrial septal defect utilizing an Amplatzer septal occluder device under guidance of intracardiac echo.  Recommendations: Overnight observation with an echocardiogram and a chest x-ray in the morning. If no problems the patient will be eligible for discharge tomorrow.  Tonny Bollman 12/31/2011, 6:10 PM

## 2011-12-31 NOTE — H&P (View-Only) (Signed)
HPI:  Ms. Natalie Lowe is a delightful 31 year old woman presenting for evaluation of ASD. The patient was diagnosed with a heart murmur and underwent a screening echocardiogram. This demonstrated findings consistent with an atrial septal defect and right heart enlargement. She underwent a transesophageal echo by Dr. Mariah Milling and this confirmed a fairly large secundum ASD. In some views there appears to be continuity of tissue but in other views there is clear discontinuity of tissue in the atrial septum.  The patient complains of shortness of breath, sometimes at rest and sometimes with exertion. She has no orthopnea, PND, or edema. She has rare palpitations. She has no chest pain.  The patient has no history of stroke or TIA.  Her family history is significant in that her mother had an ASD and underwent surgical closure.  Outpatient Encounter Prescriptions as of 12/10/2011  Medication Sig Dispense Refill  . aspirin 81 MG tablet Take 160 mg by mouth daily.      Marland Kitchen FLUoxetine (PROZAC) 20 MG tablet Take 1 tablet (20 mg total) by mouth daily.  30 tablet  11  . fluticasone (FLONASE) 50 MCG/ACT nasal spray Place 2 sprays into the nose daily.        Marland Kitchen GLUCOSAMINE PO Take by mouth as needed.        Marland Kitchen ibuprofen (ADVIL,MOTRIN) 200 MG tablet Take 200 mg by mouth every 6 (six) hours as needed.        . loratadine (CLARITIN) 10 MG tablet Take 10 mg by mouth daily.        . naproxen sodium (ALEVE) 220 MG tablet Take 220 mg by mouth as directed.        Marland Kitchen omeprazole (PRILOSEC OTC) 20 MG tablet Take 20 mg by mouth daily.        . progesterone (PROMETRIUM) 100 MG capsule Take 100 mg by mouth daily.      . clopidogrel (PLAVIX) 75 MG tablet Take 1 tablet (75 mg total) by mouth daily.  30 tablet  11  . DISCONTD: Fexofenadine HCl (ALLEGRA PO) Take by mouth as directed.        Marland Kitchen DISCONTD: Lorita Officer Triphasic (ORTHO TRI-CYCLEN, 28, PO) Take 1 tablet by mouth daily.          Paroxetine  Past Medical History    Diagnosis Date  . Depression   . Allergy     allergic rhinitis    Past Surgical History  Procedure Date  . Knee arthroscopy 2008    right  . Oates procedure 94-97    seven knee signs for torn ACL,menicusal tear    History   Social History  . Marital Status: Married    Spouse Name: N/A    Number of Children: N/A  . Years of Education: N/A   Occupational History  . Not on file.   Social History Main Topics  . Smoking status: Former Smoker    Quit date: 11/02/2000  . Smokeless tobacco: Not on file  . Alcohol Use: Yes  . Drug Use: No  . Sexually Active:    Other Topics Concern  . Not on file   Social History Narrative  . No narrative on file    Family History  Problem Relation Age of Onset  . Hypertension Mother   . Cancer Maternal Aunt     breast CA    ROS: General: no fevers/chills/night sweats Eyes: no blurry vision, diplopia, or amaurosis ENT: no sore throat or hearing loss Resp: no cough, wheezing, or  hemoptysis CV: no edema or palpitations GI: no abdominal pain, nausea, vomiting, diarrhea, or constipation GU: no dysuria, frequency, or hematuria Skin: no rash Neuro: no headache, numbness, tingling, or weakness of extremities Musculoskeletal: no joint pain or swelling Heme: no bleeding, DVT, or easy bruising Endo: no polydipsia or polyuria  BP 106/54  Pulse 75  Ht 5\' 4"  (1.626 m)  Wt 72.576 kg (160 lb)  BMI 27.46 kg/m2  PHYSICAL EXAM: Pt is alert and oriented, WD, WN, in no distress. HEENT: normal Neck: JVP normal. Carotid upstrokes normal without bruits. No thyromegaly. Lungs: equal expansion, clear bilaterally CV: Apex is discrete and nondisplaced, RRR with grade 2/6 systolic murmur at the left sternal border Abd: soft, NT, +BS, no bruit, no hepatosplenomegaly Back: no CVA tenderness Ext: no C/C/E        Femoral pulses 2+= without bruits        DP/PT pulses intact and = Skin: warm and dry without rash Neuro: CNII-XII intact              Strength intact = bilaterally  EKG:  Normal sinus rhythm with an RSR prime pattern in V1.  ASSESSMENT AND PLAN:

## 2012-01-01 ENCOUNTER — Encounter (HOSPITAL_COMMUNITY): Payer: Self-pay | Admitting: Physician Assistant

## 2012-01-01 ENCOUNTER — Ambulatory Visit (HOSPITAL_COMMUNITY): Payer: PRIVATE HEALTH INSURANCE

## 2012-01-01 ENCOUNTER — Other Ambulatory Visit: Payer: Self-pay

## 2012-01-01 DIAGNOSIS — Q211 Atrial septal defect: Secondary | ICD-10-CM

## 2012-01-01 LAB — POCT ACTIVATED CLOTTING TIME: Activated Clotting Time: 144 seconds

## 2012-01-01 MED ORDER — IBUPROFEN 200 MG PO TABS: 600.0000 mg | ORAL_TABLET | Freq: Two times a day (BID) | ORAL | Status: DC | PRN

## 2012-01-01 MED ORDER — PANTOPRAZOLE SODIUM 40 MG PO TBEC: 40.0000 mg | DELAYED_RELEASE_TABLET | Freq: Every day | ORAL | Status: DC

## 2012-01-01 NOTE — Progress Notes (Signed)
  Echocardiogram 2D Echocardiogram has been performed.  Natalie Lowe, Real Cons 01/01/2012, 10:46 AM

## 2012-01-01 NOTE — Discharge Instructions (Signed)
Atrial Septal Defect An atrial septal defect (ASD) is a hole in the heart. This hole is located in the thin tissue (septum) that separates the two upper chambers of the heart (right and left atrium). We all have this hole while we are growing inside the womb. This hole is necessary for our development. A few minutes after we are born this hole normally closes. The hole closes so no blood can go between the right and left atrium.  For your heart to work efficiently, blood flow in the heart has a regular pathway. Normally, blood from the right side of the heart is pumped to the lungs where the blood is oxygenated. The oxygenated blood from the lungs is then pumped to the left side of the heart. From the left side of the heart, blood is pumped out to the rest of your body.  When an atrial septal defect occurs, your blood takes an abnormal path in the heart. The ASD allows blood from the left side of the heart (left atrium) to mix with blood in the right side of the heart (right atrium). The blood is then recirculated to the lungs and left side of the heart. In other words, the blood makes the trip twice. An ASD makes the heart work harder by increasing the amount of blood into the right side of the heart. This causes heart overload and eventually weakens the hearts ability to pump. SYMPTOMS  The symptoms of ASD vary depending upon the size of the hole and the amount of blood that goes into the right side of the heart (right atrium). These symptoms may include:  No symptoms at all.   Tiredness or fatigue.   Trouble breathing or shortness of breath.   Irregular heartbeats (arrhythmias).   Heart murmurs ("swishing" type heart sounds ).  THERE ARE THREE LOCATIONS ON THE SEPTUM WHERE AN ASD MAY OCCUR  The mid septum (ostium secundum), is the most common type. It is located in the middle of the septum.   The lower septum (ostium primum), is the second most common type. It is located in the lower portion  of the septum. It may be associated with a mitral valve defect.   The upper septum (sinus venosus), is the least common type. It is located in the upper portion of the septum.  DIAGNOSIS  In order to diagnose ASD, tests will need to be performed. Some of the tests you may have are:  Electrocardiogram (EKG). An EKG traces the electrical activity of your heart and prints it out on paper. It may show findings that are suggestive of an enlarged right atrium and right ventricle.   Chest X-ray is an imaging test that may show changes in the structure of your heart and lungs.   MRI(computerized magnetic scan) and CT scans (computerized X-ray scan) are special imaging scans that provide very detailed images of the heart.   Nuclear medicine blood flow study is an imaging test that shows how much blood is being passed through the ASD. This test uses a very small amount of radioactive material that is absorbed into the tissues. This helps the ASD show up better on imaging pictures. Nuclear medicine scanning is very safe. Drinking plenty of fluids after the scan will help to eliminate the radioactive material from your body.  Advanced specialized testing may include:   Echocardiography uses ultrasound to obtain images of the heart. This test transmits and bounces sound waves off the heart to produce pictures your caregiver  can study. There are 2 types of echocardiograms:   Transthoracic echocardiography (TTE). A TTE obtains views by applying gel to your chest and then moving a probe over your chest. The gel helps transmit sound waves so images of the heart can be produced. A TTE is very sensitive for detecting ostium primum or ostium secundum atrial septal defects. It is not as sensitive in detecting the third type of atrial septal defect (sinus venosus).   Transesophageal echocardiography (TEE). For this type of echocardiogram, you will need a medicine to help you relax (sedative) and a numbing medicine  applied to the back of your throat. A special type of probe is passed into your mouth and down the tube that goes to your stomach (esophagus). By passing the probe into the esophagus, clearer pictures are obtained because it is closer to the heart. A TEE is especially helpful in patients who have a thin or easily moved (mobile) septum, making ASD detection more accurate.   Cardiac catheterization examines the blood flow of the heart, how well the heart pumps and can also detect oxygen levels in the heart. A cardiac catheterization is a procedure where a thin plastic tube (catheter) is inserted into a large blood vessel in your groin (you will first receive numbing medicine in your groin). From there, the catheter is advanced to your heart and a dye is injected to look at your heart and surrounding blood vessels. ASD may be suspected if high oxygen levels are detected in the right side of the heart.  TREATMENT   If the ASD is small, no treatment may be required if only a small amount of blood is moving back and forth (shunting) from the left to right atrium.   Nonsurgical closure may be done depending on the type and location of the ASD. This type of procedure is done in the cardiac catheterization lab. The groin is numbed and a small plastic tube (catheter) is inserted into a large blood vessel in the groin. The catheter is advanced to the heart where the ASD is. A special patch resembling an umbrella is threaded up the catheter and placed in the ASD hole. The patch is then "opened up" to close off the hole.   Open heart surgery may be necessary if nonsurgical closure cannot be done. If the ASD is small, the hole can be closed with stitches. If the ASD is large, a patch is sewn over the defect so the hole is closed.  Document Released: 06/02/2004 Document Revised: 07/01/2011 Document Reviewed: 05/17/2008 [facility] Patient Information 2012 [place], [place].

## 2012-01-01 NOTE — Progress Notes (Signed)
    Subjective:  Feels well. No chest pain or dyspnea. A little bit of groin site oozing.  Objective:  Vital Signs in the last 24 hours: Temp:  [97.4 F (36.3 C)-98.5 F (36.9 C)] 98.5 F (36.9 C) (03/01 0750) Pulse Rate:  [60-86] 65  (03/01 0750) Resp:  [18-20] 18  (03/01 0750) BP: (93-111)/(49-67) 101/56 mmHg (03/01 0750) SpO2:  [96 %-99 %] 99 % (03/01 0750) Weight:  [72.576 kg (160 lb)] 72.576 kg (160 lb) (02/28 1129)  Intake/Output from previous day: 02/28 0701 - 03/01 0700 In: 659 [I.V.:659] Out: 1250 [Urine:1250]  Physical Exam: Pt is alert and oriented, NAD HEENT: normal Neck: JVP - normal, carotids 2+= without bruits Lungs: CTA bilaterally CV: RRR without murmur or gallop Abd: soft, NT, Positive BS, no hepatomegaly Ext: no C/C/E, distal pulses intact and equal. Right groin site clear. Skin: warm/dry no rash   Lab Results:  Basename 12/31/11 1215  WBC 4.8  HGB 11.9*  PLT 244    Basename 12/31/11 1215  NA 141  K 4.4  CL 106  CO2 25  GLUCOSE 78  BUN 11  CREATININE 0.79   No results found for this basename: TROPONINI:2,CK,MB:2 in the last 72 hours  Cardiac Studies: CXR - interpretation pending, by my review device position is appropriate.   Tele: sinus rhythm - no arrhythmia  Assessment/Plan:  1. ASD s/p Transcatheter Closure. Await 2D echo this morning. CXR looks good. Plan 3-6 months of ASA and plavix. SBE prophylaxis x 6 months. No strenuous lifting x 1 month. OK to resume normal exercise 1 week. Will follow-up with same day office visit and echo in 4 weeks.   Tonny Bollman, M.D. 01/01/2012, 8:00 AM

## 2012-01-01 NOTE — Progress Notes (Signed)
   CARE MANAGEMENT NOTE 01/01/2012  Patient:  [facility]   Account Number:  [id number]  Date Initiated:  01/01/2012  Documentation initiated by:  GRAVES-BIGELOW,Demetris Capell  Subjective/Objective Assessment:   pt plan for d/c today with plavix. CM did discuss where medicaiton can be obtained at cheaper cost. No other needs assessed by CM at this time.     Action/Plan:   Anticipated DC Date:  01/01/2012   Anticipated DC Plan:  HOME/SELF CARE      DC Planning Services  CM consult      Choice offered to / List presented to:             Status of service:  Completed, signed off Medicare Important Message given?   (If response is "NO", the following Medicare IM given date fields will be blank) Date Medicare IM given:   Date Additional Medicare IM given:    Discharge Disposition:  HOME/SELF CARE  Per UR Regulation:    Comments:

## 2012-01-01 NOTE — Discharge Summary (Signed)
Discharge Summary   Patient ID: Natalie Lowe MRN: [id number], DOB/AGE: Nov 07, 1980 31 y.o. Admit date: 12/31/2011 D/C date:     01/01/2012   Primary Discharge Diagnoses:  1. Atrial septal defect with L->R shunt s/p transcatheter ASD closure 12/31/11  Secondary Discharge Diagnoses:  1. Depression 2. Allergic rhinitis  Hospital Course: Natalie Lowe is a 31 year old woman who presented to Dr. Earmon Phoenix office for evaluation of ASD. The patient was diagnosed with a heart murmur and underwent a screening echocardiogram. This demonstrated findings consistent with an atrial septal defect and right heart enlargement. She underwent a transesophageal echo by Dr. Mariah Milling and this confirmed a fairly large secundum ASD. The patient complained of shortness of breath, sometimes at rest and sometimes with exertion. She had no orthopnea, PND, or edema. She has rare palpitations. She denied chest pain and also denied history of stroke or TIA. Dr. 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 Given her symptomatic SOB, he recommended closure of her defect and discussed the options, risks, benefits and alternatives. She was started on Plavix and brought in 12/31/11 for closure and had successful closure of a 24 mm ostium secundum atrial septal defect utilizing an Amplatzer septal occluder device under guidance of intracardiac echo. The patient was observed overnight. Follow-up CXR was stable. 2D echo this morning showed that the closure was in place. It did show a trivial pericardial effusion which was reviewed with Dr. Excell Seltzer who would like her to have repeat echo in 4 weeks. Meanwhile she was instructed to call/return if she develops any CP, SOB or decreased exercise tolerance or any other symptoms concerning to her.. Dr. Excell Seltzer has seen and examined her and feels she is stable for discharge. Per discussion with him, her home  medicines including Prozac will be continued along with ASA/Plavix, except that Dr. Excell Seltzer asked her to hold off on oral contraceptives for 3 months while the closure device endothelializes.   Discharge Vitals: Blood pressure 101/56, pulse 65, temperature 98.5 F (36.9 C), temperature source Oral, resp. rate 18, height 5\' 4"  (1.626 m), weight 160 lb (72.576 kg), last menstrual period 12/20/2011, SpO2 99.00%.  Labs: Lab Results  Component Value Date   WBC 4.8 12/31/2011   HGB 11.9* 12/31/2011   HCT 36.3 12/31/2011   MCV 80.1 12/31/2011   PLT 244 12/31/2011     Lab 12/31/11 1215  NA 141  K 4.4  CL 106  CO2 25  BUN 11  CREATININE 0.79  CALCIUM 10.3  PROT --  BILITOT --  ALKPHOS --  ALT --  AST --  GLUCOSE 78   Diagnostic Studies/Procedures   1. Chest 2 View 01/01/2012  *RADIOLOGY REPORT*  Clinical Data: Post ASD closure.  CHEST - 2 VIEW  Comparison: None.  Findings: ASD closure device projects over the heart.  Heart is normal size.  Lungs are clear.  No effusions.  No pneumothorax or bony abnormality.  IMPRESSION: ASD closure device noted.  No acute findings.  Original Report Authenticated By: Cyndie Chime, M.D.   2. 2D Echo 01/01/12 Study Conclusions - Left ventricle: The cavity size was normal. Wall thickness was normal. Systolic function was normal. The estimated ejection fraction was in the range of 55% to 60%. Wall motion was normal; there were no regional wall motion abnormalities. - Pericardium, extracardiac: A trivial pericardial effusion was identified. Impressions: - ASD closure device in place; no residual  ASD noted.   Discharge Medications   Medication List  As of 01/01/2012 11:32 AM   STOP taking these medications         norethindrone 0.35 MG tablet      PRILOSEC OTC 20 MG tablet         TAKE these medications         aspirin 81 MG tablet   Take 81 mg by mouth daily.      clopidogrel 75 MG tablet   Commonly known as: PLAVIX   Take 75 mg by mouth daily.        FLUoxetine 20 MG tablet   Commonly known as: PROZAC   Take 20 mg by mouth daily.      fluticasone 50 MCG/ACT nasal spray   Commonly known as: FLONASE   Place 2 sprays into the nose daily.      ibuprofen 200 MG tablet   Commonly known as: ADVIL,MOTRIN   Take 3 tablets (600 mg total) by mouth 2 (two) times daily as needed. For pain      loratadine 10 MG tablet   Commonly known as: CLARITIN   Take 10 mg by mouth daily.      Melatonin 3 MG Caps   Take 3 mg by mouth at bedtime as needed. For sleep      pantoprazole 40 MG tablet   Commonly known as: PROTONIX   Take 1 tablet (40 mg total) by mouth daily.          She was instructed that ibuprofen can increase risk of stomach bleeding while taking medicines like aspirin and Plavix so use sparingly. She was also told that omeprazole (prilosec) can interact with Plavix. Please take this medicine instead of Omeprazole for less chance of interaction.  Disposition   The patient will be discharged in stable condition to home. Discharge Orders    Future Appointments: Provider: Department: Dept Phone: Center:   02/01/2012 10:30 AM [facility]  None   02/01/2012 11:45 AM Micheline Chapman, MD [facility] [phone] [id number]     Future Orders Please Complete By Expires   Diet - low sodium heart healthy      Increase activity slowly      Comments:   No driving for today, but you may resume tomorrow (01/02/12). No heavy lifting for 1 month. OK to resume normal exercise in 1 week. You may return to work on 01/11/12. Keep procedure site clean & dry. If you notice increased pain, swelling, bleeding or pus, call/return!  You may shower, but no soaking baths/hot tubs/pools for 1 week.      Discharge instructions      Comments:   If you will be having any dental work done, please contact Dr. Earmon Phoenix office as you will require pre-medication with antibiotics for the next 6 months (SBE prophylaxis).  Dr. Excell Seltzer  has requested that you stop your oral contraceptive medicine for 3 months while your closure device settles. Please use a backup method of contraception during this time.  Dr. Excell Seltzer would like you to continue Aspirin & Plavix for a minimum of 3 months, but possibly up to 6 months. Please continue to follow up with him to determine when you should stop these medicines.     Follow-up Information    Follow up with Tonny Bollman, MD. (02/01/12 at 10:30am for heart ultrasound followed by 11:45am appointment with Dr. Excell Seltzer)    Contact information:   [facility]  OFFICE [address], [address] [place] [address] [phone]            Duration of Discharge Encounter: Greater than 30 minutes including physician and PA time.  Signed, Ronie Spies PA-C 01/01/2012, 11:32 AM

## 2012-01-02 NOTE — Discharge Summary (Signed)
Agree as outlined. See my progress note this same date.  Cleva Camero 01/02/2012 1:50 PM  

## 2012-02-01 ENCOUNTER — Other Ambulatory Visit (HOSPITAL_COMMUNITY): Payer: Self-pay | Admitting: Family Medicine

## 2012-02-01 ENCOUNTER — Other Ambulatory Visit: Payer: Self-pay

## 2012-02-01 ENCOUNTER — Ambulatory Visit (HOSPITAL_COMMUNITY): Payer: PRIVATE HEALTH INSURANCE | Attending: Cardiology

## 2012-02-01 ENCOUNTER — Encounter: Payer: Self-pay | Admitting: Cardiovascular Disease

## 2012-02-01 ENCOUNTER — Ambulatory Visit (INDEPENDENT_AMBULATORY_CARE_PROVIDER_SITE_OTHER): Payer: PRIVATE HEALTH INSURANCE | Admitting: Cardiovascular Disease

## 2012-02-01 VITALS — BP 110/54 | HR 58 | Ht 64.0 in | Wt 168.1 lb

## 2012-02-01 DIAGNOSIS — Q211 Atrial septal defect: Secondary | ICD-10-CM

## 2012-02-01 DIAGNOSIS — Z8249 Family history of ischemic heart disease and other diseases of the circulatory system: Secondary | ICD-10-CM

## 2012-02-01 DIAGNOSIS — Q2111 Secundum atrial septal defect: Secondary | ICD-10-CM | POA: Insufficient documentation

## 2012-02-01 DIAGNOSIS — I079 Rheumatic tricuspid valve disease, unspecified: Secondary | ICD-10-CM | POA: Insufficient documentation

## 2012-02-01 DIAGNOSIS — R002 Palpitations: Secondary | ICD-10-CM | POA: Insufficient documentation

## 2012-02-01 DIAGNOSIS — R011 Cardiac murmur, unspecified: Secondary | ICD-10-CM | POA: Insufficient documentation

## 2012-02-01 NOTE — Progress Notes (Signed)
   HPI:  A 31 year old woman presenting for followup evaluation. The patient has a secundum ASD and underwent transcatheter closure 12/31/2011. A 24 mm Amplatzer septal occluder device was used. The patient's procedure was uncomplicated and she presents today for clinical followup. She had an echocardiogram performed this morning. This showed appropriate device position without obvious evidence of residual shunt.  The patient feels well. She had some shortness of breath with exertion when she reinitiated her exercise program, but now she feels much better. She denies resting shortness of breath. She denies chest pain, edema, or palpitations. She has been compliant with her medications.  Outpatient Encounter Prescriptions as of 02/01/2012  Medication Sig Dispense Refill  . aspirin 81 MG tablet Take 81 mg by mouth daily.       . clopidogrel (PLAVIX) 75 MG tablet Take 75 mg by mouth daily.      Marland Kitchen FLUoxetine (PROZAC) 20 MG tablet Take 20 mg by mouth daily.      . fluticasone (FLONASE) 50 MCG/ACT nasal spray Place 2 sprays into the nose daily.        Marland Kitchen loratadine (CLARITIN) 10 MG tablet Take 10 mg by mouth daily.        . Melatonin 3 MG CAPS Take 3 mg by mouth at bedtime as needed. For sleep      . pantoprazole (PROTONIX) 40 MG tablet Take 1 tablet (40 mg total) by mouth daily.  30 tablet  3  . DISCONTD: ibuprofen (ADVIL,MOTRIN) 200 MG tablet Take 3 tablets (600 mg total) by mouth 2 (two) times daily as needed. For pain  30 tablet      No Known Allergies  Past Medical History  Diagnosis Date  . Depression   . Allergic rhinitis   . Atrial septal defect     with L->R shunt s/p transcatheter ASD closure 12/31/11    ROS: Negative except as per HPI  BP 110/54  Pulse 58  Ht 5\' 4"  (1.626 m)  Wt 76.259 kg (168 lb 1.9 oz)  BMI 28.86 kg/m2  PHYSICAL EXAM: Pt is alert and oriented, NAD HEENT: normal Neck: JVP - normal, carotids 2+= without bruits Lungs: CTA bilaterally CV: RRR with grade 2/6  ejection murmur at the left sternal border Abd: soft, NT, Positive BS, no hepatomegaly Ext: no C/C/E, distal pulses intact and equal Skin: warm/dry no rash  EKG:  Sinus bradycardia 58 beats per minute, RSR prime in V1 suggests RV conduction delay.  ASSESSMENT AND PLAN:

## 2012-02-01 NOTE — Patient Instructions (Signed)
Your physician wants you to follow-up in: 5 MONTHS with Dr Mariah Milling.  You will receive a reminder letter in the mail two months in advance. If you don't receive a letter, please call our office to schedule the follow-up appointment.  Your physician recommends that you continue on your current medications as directed. Please refer to the Current Medication list given to you today.  You can stop Plavix 6 months after procedure. You can restart oral contraceptive 3 months after procedure.

## 2012-02-01 NOTE — Assessment & Plan Note (Signed)
The patient is stable, now one month out from ASD closure. She will continue her current medications. She can stop Plavix at 6 months. She should remain on long-term aspirin. I advised that she could resume her oral contraceptive in 2 months, as her device should be fully endothelialized at that point. She should practice SBE prophylaxis for a total duration of 6 months from the date of device implantation. She can resume all activities with no restrictions at this point. The patient will followup with Dr. Mariah Milling for her 6 month follow-up visit and I would be happy to see her back as needed. She should have another echocardiogram in one year.

## 2012-03-17 ENCOUNTER — Other Ambulatory Visit: Payer: Self-pay

## 2012-03-17 MED ORDER — FLUTICASONE PROPIONATE 50 MCG/ACT NA SUSP: 2.0000 | Freq: Every day | NASAL | Status: DC

## 2012-03-17 NOTE — Telephone Encounter (Signed)
Please phone in to [facility] outpt pharmacy

## 2012-03-17 NOTE — Telephone Encounter (Signed)
Rx sent to pharmacy electronically.  Left message on machine at home advising patient as instructed.

## 2012-03-17 NOTE — Telephone Encounter (Signed)
Pt left v/m that pt had [facility] and it was working well and pt request Dr Milinda Antis to start prescribing.Pt can be reached at [phone] and request med called to [facility] out patient pharmacy [phone].Please advise. Pt last seen 11/18/11.

## 2012-06-27 ENCOUNTER — Telehealth: Payer: Self-pay | Admitting: Cardiovascular Disease

## 2012-06-27 DIAGNOSIS — Q211 Atrial septal defect: Secondary | ICD-10-CM

## 2012-06-27 NOTE — Telephone Encounter (Signed)
New Problem:    Patient called because she is having SOB and some of the similar symptoms she was having before her ASV was repaired.  Patient would like to know if this is normal.  Please call back.

## 2012-06-27 NOTE — Telephone Encounter (Signed)
Per Dr Excell Seltzer he would like the pt to have a repeat Echo prior to appointment next week.

## 2012-06-27 NOTE — Telephone Encounter (Signed)
I spoke with the pt and she is having episodes of SOB that are similar to her symptoms prior to ASD closure. The pt said she will get a feeling that she needs to take a deep breath and then it takes a few breaths to feel like she is getting enough oxygen. The feeling does make her uncomfortable.  The pt noticed these symptoms over for the past 2-3 weeks. The pt denies CP.  The pt has also had a couple of episodes in the past 2-3 weeks when she gets dizzy, diaphoretic and unable to concentrate.  The pt thinks these spells are related to hypoglycemia. The pt said her manager has also commented that the pt has been SOB. The pt does not think this is related to anxiety. The pt is scheduled to see Dr Mariah Milling next week for follow-up.

## 2012-06-27 NOTE — Telephone Encounter (Signed)
Order placed for Echo and [facility] will contact patient.

## 2012-06-28 NOTE — Telephone Encounter (Signed)
Per Merita Norton:  ECHO SCHEDULED 06/30/12 @ 11AM AT [facility] .PATIENT IS AWARE.

## 2012-06-30 ENCOUNTER — Ambulatory Visit: Payer: Self-pay | Admitting: Cardiovascular Disease

## 2012-06-30 DIAGNOSIS — R0602 Shortness of breath: Secondary | ICD-10-CM

## 2012-07-07 ENCOUNTER — Ambulatory Visit: Payer: PRIVATE HEALTH INSURANCE | Admitting: Cardiovascular Disease

## 2012-11-04 ENCOUNTER — Ambulatory Visit (INDEPENDENT_AMBULATORY_CARE_PROVIDER_SITE_OTHER)
Admission: RE | Admit: 2012-11-04 | Discharge: 2012-11-04 | Disposition: A | Discharge status: Home / Self Care | Payer: 59 | Source: Ambulatory Visit | Attending: Family Medicine | Admitting: Family Medicine

## 2012-11-04 ENCOUNTER — Ambulatory Visit (INDEPENDENT_AMBULATORY_CARE_PROVIDER_SITE_OTHER): Payer: 59 | Admitting: Family Medicine

## 2012-11-04 ENCOUNTER — Encounter: Payer: Self-pay | Admitting: Family Medicine

## 2012-11-04 VITALS — BP 116/64 | HR 68 | Temp 98.3°F | Ht 64.25 in | Wt 169.8 lb

## 2012-11-04 DIAGNOSIS — R0602 Shortness of breath: Secondary | ICD-10-CM

## 2012-11-04 MED ORDER — OMEPRAZOLE 20 MG PO CPDR: 20.0000 mg | DELAYED_RELEASE_CAPSULE | Freq: Every day | ORAL | Status: DC

## 2012-11-04 MED ORDER — FLUOXETINE HCL 20 MG PO TABS: 20.0000 mg | ORAL_TABLET | Freq: Every day | ORAL | Status: DC

## 2012-11-04 NOTE — Patient Instructions (Addendum)
Take omeprazole every day and watch diet for things that make reflux worse  Do not eat late at night  If needed - jack up the head of your bed with a brick Follow up with me in 1 month to see how you are doing and make a plan  Chest xray today

## 2012-11-04 NOTE — Progress Notes (Signed)
Subjective:    Patient ID: Natalie Lowe, female    DOB: 12-04-1980, 32 y.o.   MRN: [id number]  HPI Here with breathing issues Was actually sob all her life until her ASD closure Then got much much better   Last echo- had very good result   Her breathing symptoms returned in the late summer  Thinks she may have an asthma related  syndrome   She works out-and that is ok -no exercise induced problems   At rest - is when she gets symptoms  Has sudden urge to take a deep breath - all day long - over and over "sighing" almost   Did have asthma as a kid- pretty mild- used inhaler occ - was never severe   She did her own trial with her husband's advair  For 1 month - it did help quite a bit - ? If placebo or if she actually had reactive airways  She does have anxiety and takes prozac -thought it was in good control  Also has gerd Takes prilosec qod for cost- but needs it every day She does cough and throat clear a lot   Had her chest xray- last after her procedure march 1- this was ok   Patient Active Problem List  Diagnosis  . DEPRESSION  . ALLERGIC RHINITIS  . GERD  . Anxiety  . Heart murmur  . Family history of heart murmur  . ASD (atrial septal defect)  . Shortness of breath   Past Medical History  Diagnosis Date  . Depression   . Allergic rhinitis   . Atrial septal defect     with L->R shunt s/p transcatheter ASD closure 12/31/11   Past Surgical History  Procedure Date  . Knee arthroscopy 2008    right  . Oates procedure 94-97    seven knee signs for torn ACL,menicusal tear  . Asd repair    History  Substance Use Topics  . Smoking status: Former Smoker    Quit date: 11/02/2000  . Smokeless tobacco: Not on file  . Alcohol Use: Yes     Comment: occasional   Family History  Problem Relation Age of Onset  . Hypertension Mother   . Cancer Maternal Aunt     breast CA   No Known Allergies Current Outpatient Prescriptions on File Prior to Visit  Medication  Sig Dispense Refill  . aspirin 81 MG tablet Take 81 mg by mouth daily.       Marland Kitchen FLUoxetine (PROZAC) 20 MG tablet Take 1 tablet (20 mg total) by mouth daily.  30 tablet  11  . fluticasone (FLONASE) 50 MCG/ACT nasal spray Place 2 sprays into the nose daily.  16 g  11  . loratadine (CLARITIN) 10 MG tablet Take 10 mg by mouth daily.        . Melatonin 3 MG CAPS Take 3 mg by mouth at bedtime as needed. For sleep      . Norgestim-Eth Estrad Triphasic (ORTHO TRI-CYCLEN, 28, PO) Take 1 tablet by mouth daily.               Review of Systems Review of Systems  Constitutional: Negative for fever, appetite change, fatigue and unexpected weight change.  Eyes: Negative for pain and visual disturbance.  ENT pos for freq throat clearing , neg for ST or congestion  Respiratory: Negative for cough and wheeze , pos for shortness of breath at rest .   Cardiovascular: Negative for cp or palpitations  Gastrointestinal: Negative for nausea, diarrhea and constipation.  Genitourinary: Negative for urgency and frequency.  Skin: Negative for pallor or rash   Neurological: Negative for weakness, light-headedness, numbness and headaches.  Hematological: Negative for adenopathy. Does not bruise/bleed easily.  Psychiatric/Behavioral: Negative for dysphoric mood. The patient is not nervous/anxious.         Objective:   Physical Exam  Constitutional: She appears well-developed and well-nourished. No distress.  HENT:  Head: Normocephalic and atraumatic.  Right Ear: External ear normal.  Left Ear: External ear normal.  Nose: Nose normal.  Mouth/Throat: Oropharynx is clear and moist.       Nares are boggy but clear Throat is clear   Eyes: Conjunctivae normal and EOM are normal. Pupils are equal, round, and reactive to light. Right eye exhibits no discharge. Left eye exhibits no discharge. No scleral icterus.  Neck: Normal range of motion. Neck supple. No JVD present. Carotid bruit is not present. No tracheal  deviation present. No thyromegaly present.  Cardiovascular: Normal rate, regular rhythm, normal heart sounds and intact distal pulses.  Exam reveals no gallop.   Pulmonary/Chest: Effort normal and breath sounds normal. No respiratory distress. She has no wheezes. She has no rales. She exhibits no tenderness.       No wheeze even on forced exp  Abdominal: Soft. Bowel sounds are normal. She exhibits no distension and no mass. There is no tenderness. There is no rebound and no guarding.  Musculoskeletal: She exhibits no edema.       No leg swelling or tenderness Neg homan's sign  Lymphadenopathy:    She has no cervical adenopathy.  Neurological: She is alert. She has normal reflexes. No cranial nerve deficit. She exhibits normal muscle tone. Coordination normal.  Skin: Skin is warm and dry. No rash noted. No erythema. No pallor.  Psychiatric: She has a normal mood and affect. Her mood appears not anxious. Her affect is not blunt and not labile. She does not exhibit a depressed mood.          Assessment & Plan:

## 2012-12-09 ENCOUNTER — Encounter: Payer: Self-pay | Admitting: Family Medicine

## 2012-12-09 ENCOUNTER — Ambulatory Visit (INDEPENDENT_AMBULATORY_CARE_PROVIDER_SITE_OTHER): Payer: 59 | Admitting: Family Medicine

## 2012-12-09 VITALS — BP 124/76 | HR 75 | Temp 98.3°F | Ht 64.25 in | Wt 164.8 lb

## 2012-12-09 DIAGNOSIS — R0602 Shortness of breath: Secondary | ICD-10-CM

## 2012-12-09 DIAGNOSIS — K219 Gastro-esophageal reflux disease without esophagitis: Secondary | ICD-10-CM

## 2012-12-09 NOTE — Assessment & Plan Note (Signed)
Symptoms of throat clearing/ cough/ sob totally resolved on omeprazole Also starting to watch diet  Will continue PPI for at least a year - and then have a trial off of it

## 2012-12-09 NOTE — Assessment & Plan Note (Signed)
Suspect due to combination of anx and GERD  now resolved with tx of both of these problems  Will continue to monitor

## 2012-12-09 NOTE — Progress Notes (Signed)
Subjective:    Patient ID: Natalie Lowe, female    DOB: 1981/09/19, 33 y.o.   MRN: [id number]  HPI Here for f/u of dyspnea   Last visit disc nl echo since ASD closure Then had cxr that was unremarkable Started her on PPI - with GERD in differential with throat clearing   As it turns out - symptoms are much better Only coughs if she eats something very acidic -- and rarely clears her throat   Though some of it is still anxiety- takes prozac and goes to counseling  Wt is down 5 lb ( diet is good - better than during the holidays)   Largely controlling both problems   Patient Active Problem List  Diagnosis  . DEPRESSION  . ALLERGIC RHINITIS  . GERD  . Anxiety  . Heart murmur  . Family history of heart murmur  . ASD (atrial septal defect)  . Shortness of breath   Past Medical History  Diagnosis Date  . Depression   . Allergic rhinitis   . Atrial septal defect     with L->R shunt s/p transcatheter ASD closure 12/31/11   Past Surgical History  Procedure Date  . Knee arthroscopy 2008    right  . Oates procedure 94-97    seven knee signs for torn ACL,menicusal tear  . Asd repair    History  Substance Use Topics  . Smoking status: Former Smoker    Quit date: 11/02/2000  . Smokeless tobacco: Not on file  . Alcohol Use: Yes     Comment: occasional   Family History  Problem Relation Age of Onset  . Hypertension Mother   . Cancer Maternal Aunt     breast CA   No Known Allergies Current Outpatient Prescriptions on File Prior to Visit  Medication Sig Dispense Refill  . aspirin 81 MG tablet Take 81 mg by mouth daily.       Marland Kitchen FLUoxetine (PROZAC) 20 MG tablet Take 1 tablet (20 mg total) by mouth daily.  30 tablet  11  . fluticasone (FLONASE) 50 MCG/ACT nasal spray Place 2 sprays into the nose daily.  16 g  11  . loratadine (CLARITIN) 10 MG tablet Take 10 mg by mouth daily.        . Melatonin 3 MG CAPS Take 3 mg by mouth at bedtime as needed. For sleep      .  Norgestim-Eth Estrad Triphasic (ORTHO TRI-CYCLEN, 28, PO) Take 1 tablet by mouth daily.      Marland Kitchen omeprazole (PRILOSEC) 20 MG capsule Take 1 capsule (20 mg total) by mouth daily.  30 capsule  11        Review of Systems Review of Systems  Constitutional: Negative for fever, appetite change, fatigue and unexpected weight change.  Eyes: Negative for pain and visual disturbance.  Respiratory: Negative for cough and shortness of breath.   Cardiovascular: Negative for cp or palpitations    Gastrointestinal: Negative for nausea, diarrhea and constipation. neg for heartburn or throat clearing  Genitourinary: Negative for urgency and frequency.  Skin: Negative for pallor or rash   Neurological: Negative for weakness, light-headedness, numbness and headaches.  Hematological: Negative for adenopathy. Does not bruise/bleed easily.  Psychiatric/Behavioral: Negative for dysphoric mood. The patient is anxious and working on that          Objective:   Physical Exam  Constitutional: She appears well-developed and well-nourished. No distress.  HENT:  Head: Normocephalic and atraumatic.  Mouth/Throat: Oropharynx is clear and  moist.  Eyes: Conjunctivae and EOM are normal. Pupils are equal, round, and reactive to light. No scleral icterus.  Neck: Normal range of motion. Neck supple.  Cardiovascular: Normal rate and regular rhythm.   Pulmonary/Chest: Effort normal and breath sounds normal. No respiratory distress. She has no wheezes. She has no rales.  Abdominal: Soft. Bowel sounds are normal. She exhibits no distension and no mass. There is no tenderness.  Musculoskeletal: She exhibits no edema.  Lymphadenopathy:    She has no cervical adenopathy.  Neurological: She is alert.  Skin: Skin is warm and dry. No pallor.  Psychiatric: She has a normal mood and affect.  Cheerful/not anxious appearing today          Assessment & Plan:

## 2012-12-09 NOTE — Patient Instructions (Addendum)
I'm glad you are feeling better  Stay on the omeprazole and also the prozac Chest xray was normal  Eat a healthy diet - stay away from spicy and acidic food/ beverages

## 2012-12-17 ENCOUNTER — Other Ambulatory Visit: Payer: Self-pay

## 2013-02-04 IMAGING — CR DG CHEST 2V
2 series · 2 of 2 positions shown · non-contrast
Comparison: None.

CLINICAL DATA: Post ASD closure.

CHEST - 2 VIEW

[w chest pa]
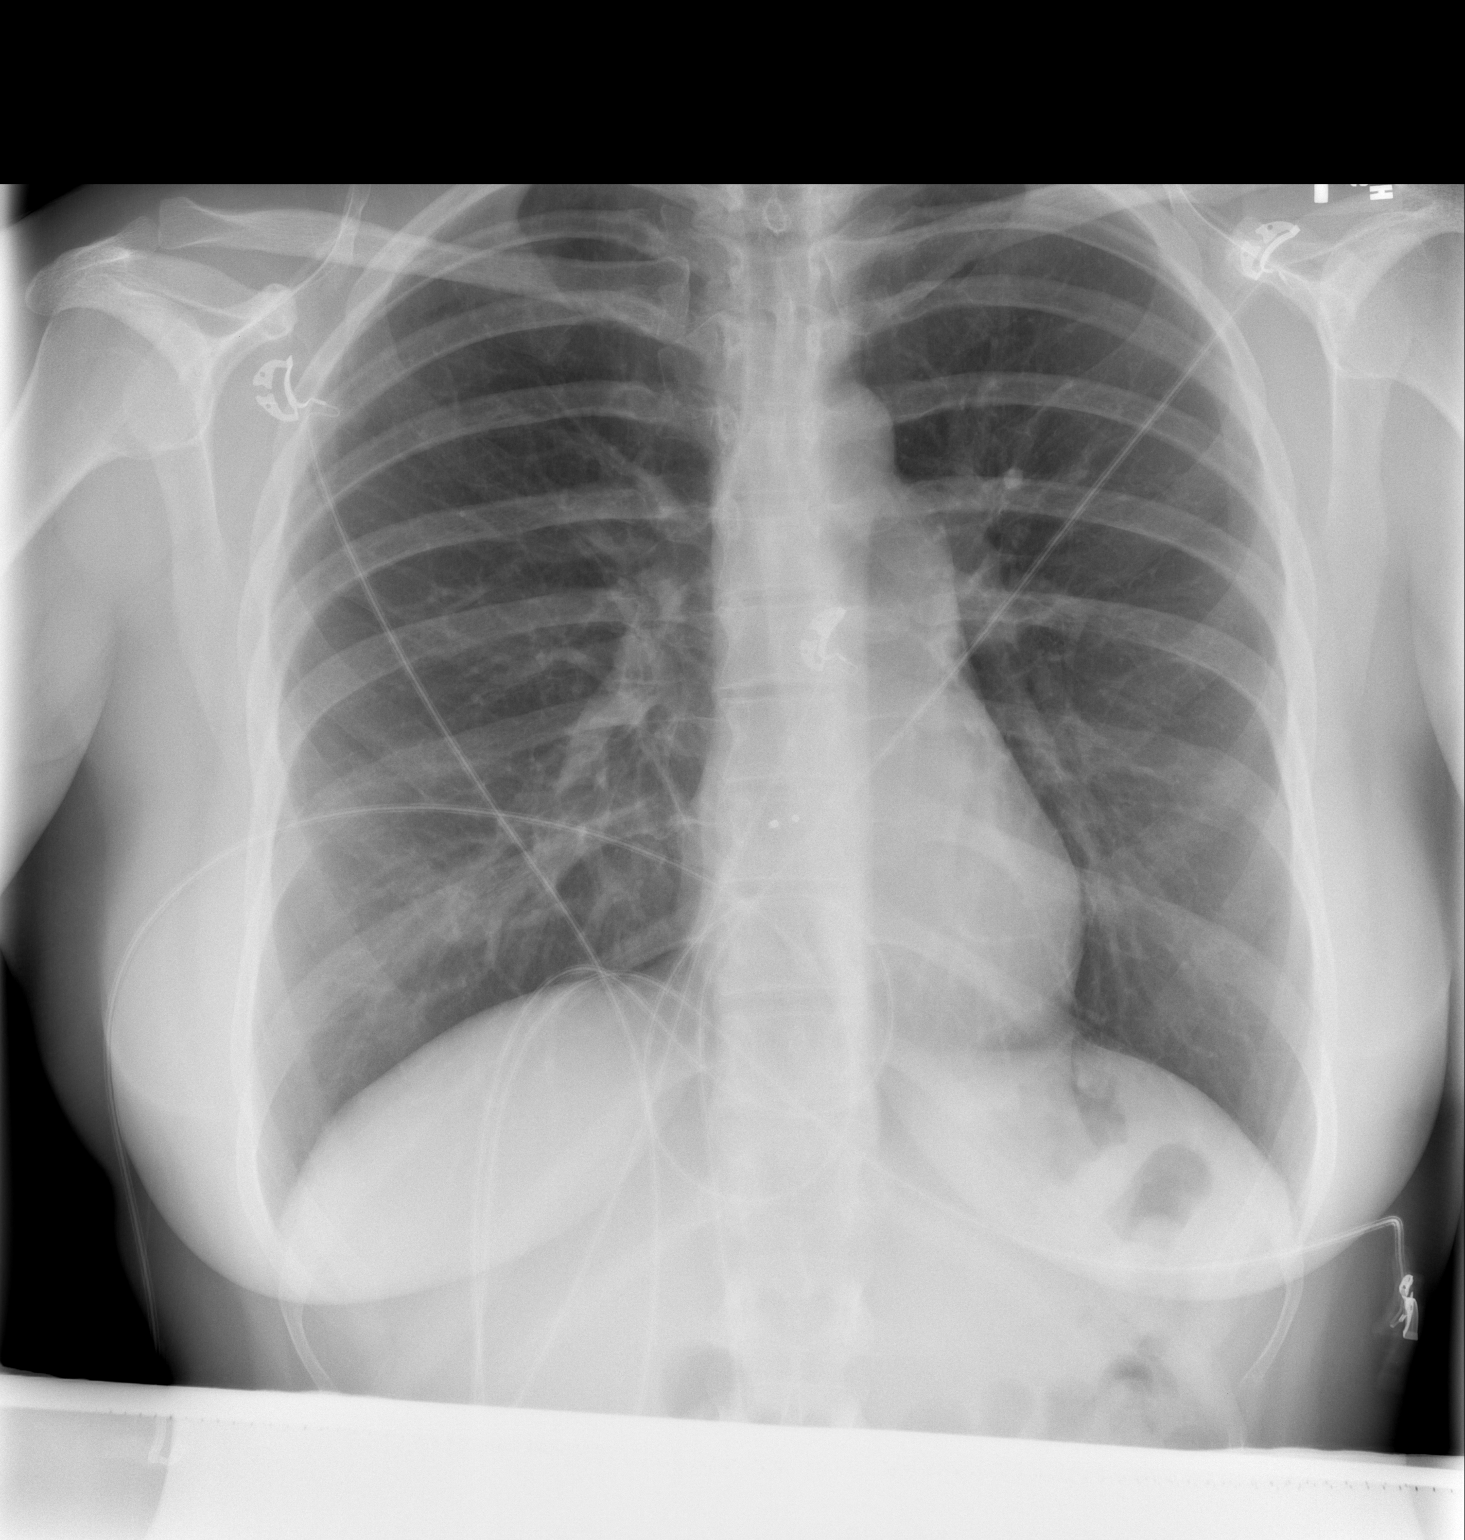

[w chest lat]
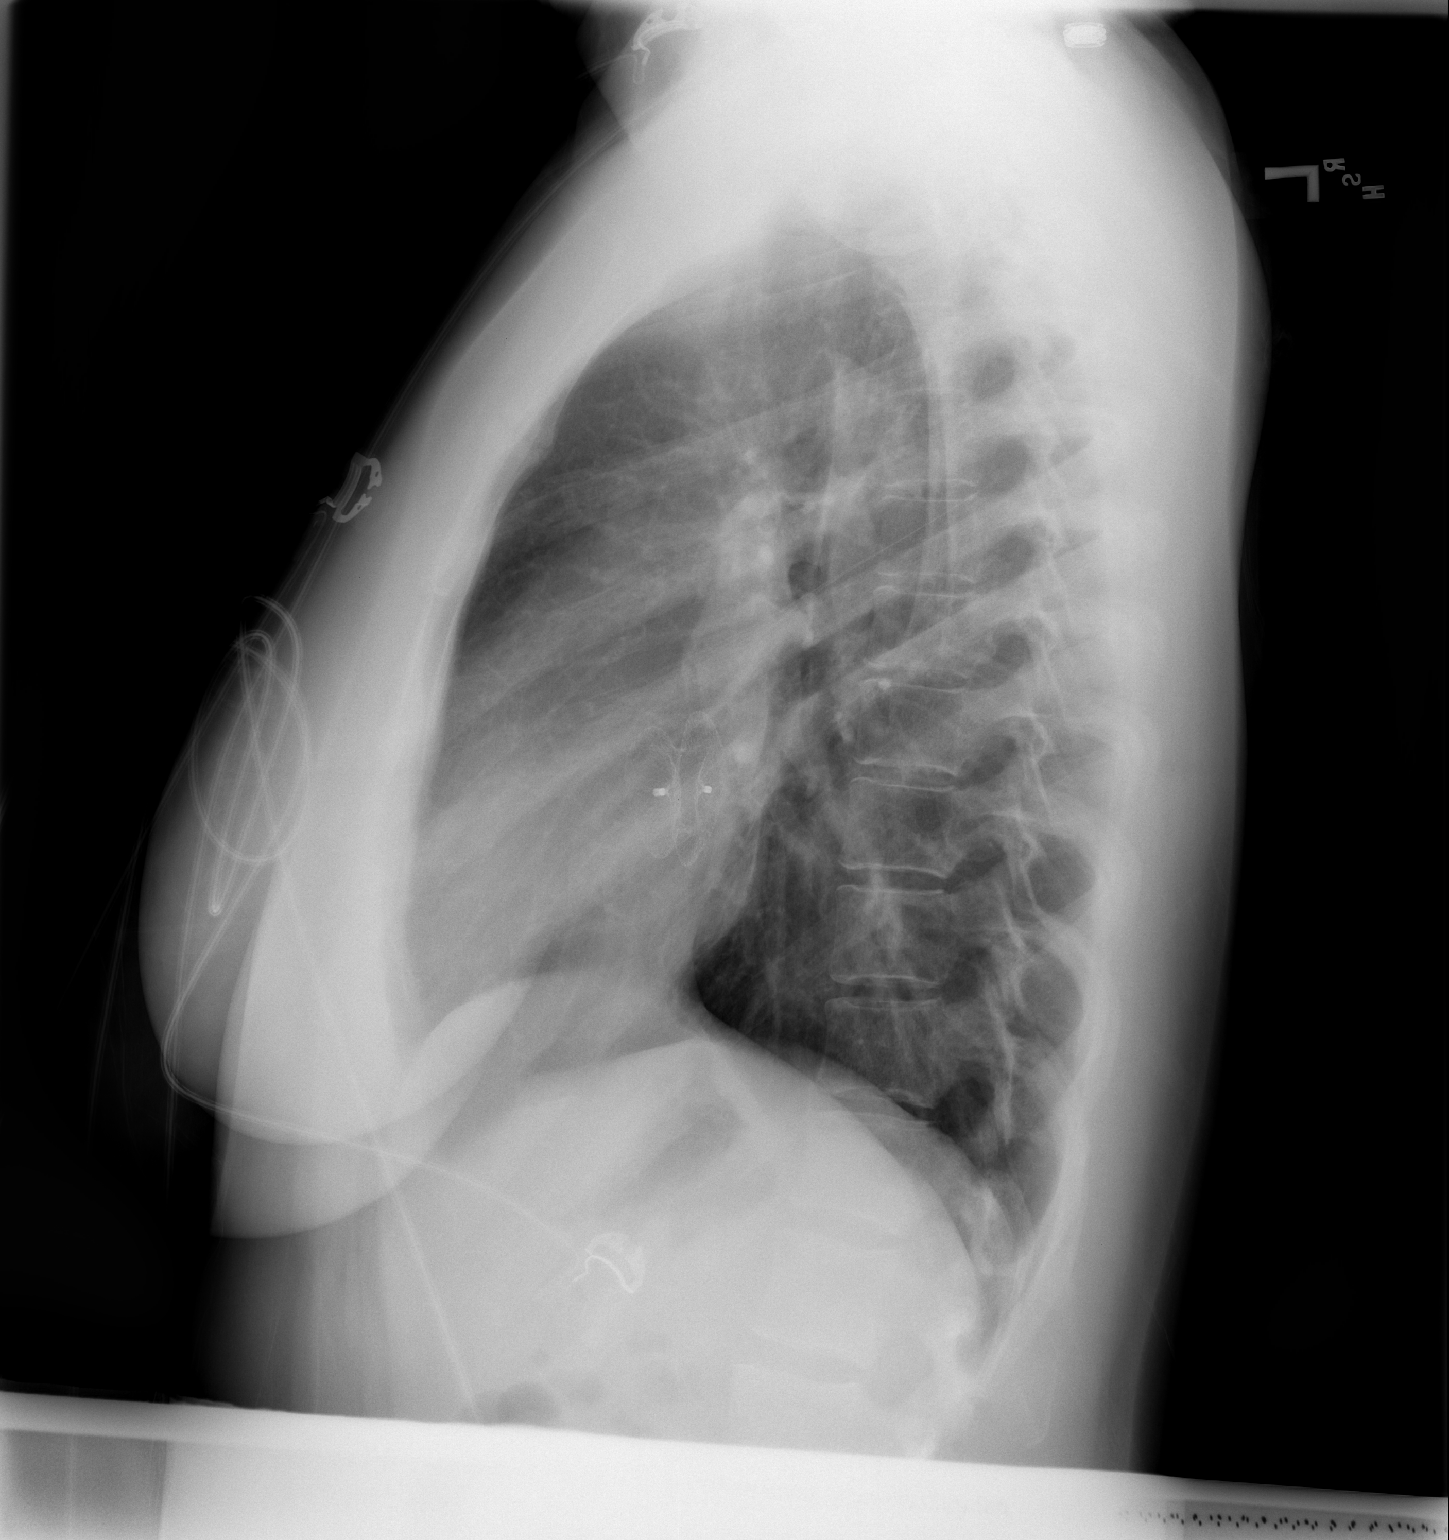

[2 of 2 positions shown; findings below may reference images not displayed]

FINDINGS: ASD closure device projects over the heart.  Heart is
normal size.  Lungs are clear.  No effusions.  No pneumothorax or
bony abnormality.
IMPRESSION: ASD closure device noted.  No acute findings.

## 2013-04-05 ENCOUNTER — Other Ambulatory Visit: Payer: Self-pay | Admitting: Family Medicine

## 2013-04-05 MED ORDER — FLUTICASONE PROPIONATE 50 MCG/ACT NA SUSP: 2.0000 | Freq: Every day | NASAL | Status: DC

## 2013-09-07 ENCOUNTER — Other Ambulatory Visit: Payer: Self-pay

## 2013-11-03 ENCOUNTER — Other Ambulatory Visit: Payer: Self-pay | Admitting: Family Medicine

## 2013-11-03 NOTE — Telephone Encounter (Signed)
Electronic refill request, no recent/future appt., please advise  

## 2013-11-05 NOTE — Telephone Encounter (Signed)
Please schedule f/u in spring and refill until then 

## 2013-11-07 NOTE — Telephone Encounter (Signed)
Appointment scheduled.   Refills sent.

## 2013-12-04 ENCOUNTER — Other Ambulatory Visit: Payer: Self-pay | Admitting: Family Medicine

## 2013-12-04 NOTE — Telephone Encounter (Signed)
If she is still taking it - go ahead and refill for 6 months, thanks

## 2013-12-04 NOTE — Telephone Encounter (Signed)
Last office visit 12/19/2012.  Mediation marked out on med list.  Ok to refill?

## 2013-12-05 NOTE — Telephone Encounter (Signed)
Spoke with pt and she advised that she is taking Rx, refill done

## 2014-02-08 ENCOUNTER — Other Ambulatory Visit: Payer: Self-pay | Admitting: Family Medicine

## 2014-02-09 ENCOUNTER — Ambulatory Visit: Payer: 59 | Admitting: Family Medicine

## 2014-02-14 ENCOUNTER — Encounter: Payer: Self-pay | Admitting: Family Medicine

## 2014-02-14 ENCOUNTER — Ambulatory Visit (INDEPENDENT_AMBULATORY_CARE_PROVIDER_SITE_OTHER): Payer: 59 | Admitting: Family Medicine

## 2014-02-14 VITALS — BP 118/76 | HR 64 | Temp 98.6°F | Ht 64.25 in | Wt 185.5 lb

## 2014-02-14 DIAGNOSIS — F411 Generalized anxiety disorder: Secondary | ICD-10-CM

## 2014-02-14 DIAGNOSIS — E669 Obesity, unspecified: Secondary | ICD-10-CM | POA: Insufficient documentation

## 2014-02-14 DIAGNOSIS — K219 Gastro-esophageal reflux disease without esophagitis: Secondary | ICD-10-CM

## 2014-02-14 DIAGNOSIS — F419 Anxiety disorder, unspecified: Secondary | ICD-10-CM

## 2014-02-14 DIAGNOSIS — F410 Panic disorder [episodic paroxysmal anxiety] without agoraphobia: Secondary | ICD-10-CM | POA: Insufficient documentation

## 2014-02-14 MED ORDER — FLUTICASONE PROPIONATE 50 MCG/ACT NA SUSP: NASAL | Status: DC

## 2014-02-14 MED ORDER — OMEPRAZOLE 20 MG PO CPDR: DELAYED_RELEASE_CAPSULE | ORAL | Status: DC

## 2014-02-14 MED ORDER — FLUOXETINE HCL 20 MG PO CAPS: ORAL_CAPSULE | ORAL | Status: DC

## 2014-02-14 NOTE — Progress Notes (Signed)
Subjective:    Patient ID: Natalie Lowe, female    DOB: [birth year], 33 y.o.   MRN: [id number]  HPI Here for f/u of chronic medical problems   Doing pretty well overall   Needs refills of medicines    She has gained over 20 lb with bmi of 31 - very frustrated  She has cut calories and watched food intake closely- ? Calories per day Doing exercise (cardio but not resistance training) - 3-4 times per week   Needs refill on prozac Mood is generally good  Has had 2 panic attacks since last visit - will keep an eye on this  Had stress-husband had a major finger injury   flonase - also refill for allergy season   She takes prilosec otc  That helped her breathing problems - gerd was really causing her problem  Patient Active Problem List   Diagnosis Date Noted  . Panic disorder 02/14/2014  . Obesity 02/14/2014  . Shortness of breath 11/04/2012  . ASD (atrial septal defect) 12/10/2011  . Heart murmur 11/18/2011  . Family history of heart murmur 11/18/2011  . Anxiety 05/20/2011  . GERD 03/28/2010  . DEPRESSION 08/09/2008  . ALLERGIC RHINITIS 08/09/2008   Past Medical History  Diagnosis Date  . Depression   . Allergic rhinitis   . Atrial septal defect     with L->R shunt s/p transcatheter ASD closure 12/31/11   Past Surgical History  Procedure Laterality Date  . Knee arthroscopy  2008    right  . Oates procedure  94-97    seven knee signs for torn ACL,menicusal tear  . Asd repair     History  Substance Use Topics  . Smoking status: Former Smoker    Quit date: 11/02/2000  . Smokeless tobacco: Not on file  . Alcohol Use: Yes     Comment: occasional   Family History  Problem Relation Age of Onset  . Hypertension Mother   . Cancer Maternal Aunt     breast CA   No Known Allergies Current Outpatient Prescriptions on File Prior to Visit  Medication Sig Dispense Refill  . aspirin 81 MG tablet Take 81 mg by mouth daily.       Marland Kitchen. loratadine (CLARITIN) 10 MG tablet  Take 10 mg by mouth daily.        . Melatonin 3 MG CAPS Take 3 mg by mouth at bedtime as needed. For sleep      . Norgestim-Eth Estrad Triphasic (ORTHO TRI-CYCLEN, 28, PO) Take 1 tablet by mouth daily.       No current facility-administered medications on file prior to visit.     Review of Systems Review of Systems  Constitutional: Negative for fever, appetite change, fatigue and unexpected weight change.  ENt pos for sneezing and runny nose  Eyes: Negative for pain and visual disturbance.  Respiratory: Negative for cough and shortness of breath.   Cardiovascular: Negative for cp or palpitations    Gastrointestinal: Negative for nausea, diarrhea and constipation.  Genitourinary: Negative for urgency and frequency.  Skin: Negative for pallor or rash   Neurological: Negative for weakness, light-headedness, numbness and headaches.  Hematological: Negative for adenopathy. Does not bruise/bleed easily.  Psychiatric/Behavioral: Negative for dysphoric mood. The patient is at times  nervous/anxious.  pos for several panic attacks       Objective:   Physical Exam  Constitutional: She appears well-developed and well-nourished. No distress.  obese and well appearing   HENT:  Head: Normocephalic  and atraumatic.  Mouth/Throat: Oropharynx is clear and moist.  Nares are pale and boggy  Eyes: Conjunctivae and EOM are normal. Pupils are equal, round, and reactive to light. Right eye exhibits no discharge. Left eye exhibits no discharge. No scleral icterus.  Neck: Normal range of motion. Neck supple. No JVD present. No thyromegaly present.  Cardiovascular: Normal rate and regular rhythm.   Murmur heard. Pulmonary/Chest: Breath sounds normal. No respiratory distress. She has no wheezes. She has no rales.  Musculoskeletal: She exhibits no edema.  Lymphadenopathy:    She has no cervical adenopathy.  Neurological: She is alert. She has normal reflexes. No cranial nerve deficit. She exhibits normal  muscle tone. Coordination normal.  Skin: Skin is warm and dry. No rash noted. No erythema. No pallor.  Psychiatric: She has a normal mood and affect.          Assessment & Plan:

## 2014-02-14 NOTE — Progress Notes (Signed)
Pre visit review using our clinic review tool, if applicable. No additional management support is needed unless otherwise documented below in the visit note. 

## 2014-02-14 NOTE — Patient Instructions (Signed)
Check out the free APP called "myfitnesspal"  This will help with accountability for weight loss (diet and exercise)  Keep exercising and add back resistance training when you can  If more panic attacks -let me know   Medicines refilled

## 2014-02-15 NOTE — Assessment & Plan Note (Signed)
This and sob are resolved on daily prilosec Will continue this Diet discussed

## 2014-02-15 NOTE — Assessment & Plan Note (Signed)
Pt has had 2 panic attacks during times of extreme stress - otherwise symptoms are well controlled Disc coping skills  Will continue current prozac dose - but open to inc it if necessary  Will keep us updated

## 2014-02-15 NOTE — Assessment & Plan Note (Signed)
Discussed how this problem influences overall health and the risks it imposes  Reviewed plan for weight loss with lower calorie diet (via better food choices and also portion control or program like weight watchers) and exercise building up to or more than 30 minutes 5 days per week including some aerobic activity   Disc accountability program options

## 2014-02-15 NOTE — Assessment & Plan Note (Signed)
Several episodes-only during extreme stress Suspect prozac has prevented more-will continue this dose for now but update if more issues

## 2014-09-24 ENCOUNTER — Ambulatory Visit (INDEPENDENT_AMBULATORY_CARE_PROVIDER_SITE_OTHER): Payer: 59

## 2014-09-24 ENCOUNTER — Ambulatory Visit (INDEPENDENT_AMBULATORY_CARE_PROVIDER_SITE_OTHER): Payer: 59 | Admitting: Podiatry

## 2014-09-24 ENCOUNTER — Encounter: Payer: Self-pay | Admitting: Podiatry

## 2014-09-24 VITALS — BP 124/83 | HR 62 | Resp 16 | Ht 64.0 in | Wt 185.0 lb

## 2014-09-24 DIAGNOSIS — M722 Plantar fascial fibromatosis: Secondary | ICD-10-CM

## 2014-09-24 MED ORDER — METHYLPREDNISOLONE (PAK) 4 MG PO TABS
ORAL_TABLET | ORAL | Status: DC
Start: 2014-09-24 — End: 2014-12-05

## 2014-09-24 MED ORDER — MELOXICAM 15 MG PO TABS: 15.0000 mg | ORAL_TABLET | Freq: Every day | ORAL | Status: DC

## 2014-09-24 NOTE — Progress Notes (Signed)
   Subjective:    Patient ID: Natalie Lowe, female    DOB: [birth year], 33 y.o.   MRN: [id number]  HPI Comments: i have rt heel pain. Its started 3 - 4 months ago and its getting worse. It hurts to walk and stand. It always hurts, it gets worse through out the day. i have taken advil, rolled my foot on tennis ball, ice, different shoes, bought new shoes, and that's it.  Foot Pain      Review of Systems  Constitutional: Positive for activity change.  Musculoskeletal:       Difficulty walking  All other systems reviewed and are negative.      Objective:   Physical Exam: I have reviewed her past history medications allergies surgery social history and review of systems area pulses are strongly palpable right foot. Neurologic sensorium is intact. The tendon reflexes are intact as is muscle strength +5 over 5 dorsiflexion plantar flexors and inverters and everters right foot. Orthopedic evaluation and x-rays all joints distal to the ankle for range of motion without crepitation. Pain on palpation medial calcaneal tubercle of the right heel. Radiographic evaluation demonstrates soft tissue increase in density at the plantar fascial calcaneal insertion site. Mild hallux valgus deformity right. Small plantar distally oriented calcaneal heel spur. Cutaneous evaluation demonstrates soft supple skin no open lesions and no signs of infections.        Assessment & Plan:  Assessment: Plantar fasciitis right foot.  Plan: Discussed etiology pathology conservative versus surgical therapies at this point I injected her right heel today with Kenalog and local anesthetic. Prescribed a Medrol Dosepak to be followed by meloxicam. She was placed in a plantar fascial brace and dispensed a night splint. Discussed appropriate shoe gear stretching exercises ice therapy shoe gear modifications. I will follow up with her in 1 month should she have questions or concerns she will notify us immediately.

## 2014-10-11 ENCOUNTER — Encounter (HOSPITAL_COMMUNITY): Payer: Self-pay | Admitting: Cardiovascular Disease

## 2014-10-24 ENCOUNTER — Encounter: Payer: Self-pay | Admitting: Podiatry

## 2014-10-24 ENCOUNTER — Ambulatory Visit (INDEPENDENT_AMBULATORY_CARE_PROVIDER_SITE_OTHER): Payer: 59 | Admitting: Podiatry

## 2014-10-24 DIAGNOSIS — M722 Plantar fascial fibromatosis: Secondary | ICD-10-CM

## 2014-10-24 NOTE — Progress Notes (Signed)
She presents today for follow-up of plantar fasciitis to her right heel. She continues all conservative therapies. She states that she is 100% improved.  Objective: Vital signs are stable she is alert and oriented 3. Pulses are strongly palpable. No pain on palpation medially continued tubercle of the right heel.  Assessment: Well-healing plantar fasciitis right.  Plan: Continue all conservative therapies 1 more month and then discontinue provided she is having no pain. Should she regress she is to notify us immediately.

## 2014-12-05 ENCOUNTER — Ambulatory Visit (INDEPENDENT_AMBULATORY_CARE_PROVIDER_SITE_OTHER): Payer: 59 | Admitting: Family Medicine

## 2014-12-05 ENCOUNTER — Encounter: Payer: Self-pay | Admitting: Family Medicine

## 2014-12-05 VITALS — BP 118/68 | HR 65 | Temp 98.0°F | Wt 188.0 lb

## 2014-12-05 DIAGNOSIS — J309 Allergic rhinitis, unspecified: Secondary | ICD-10-CM

## 2014-12-05 DIAGNOSIS — Z23 Encounter for immunization: Secondary | ICD-10-CM

## 2014-12-05 DIAGNOSIS — F419 Anxiety disorder, unspecified: Secondary | ICD-10-CM

## 2014-12-05 MED ORDER — FLUTICASONE PROPIONATE 50 MCG/ACT NA SUSP: NASAL | Status: DC

## 2014-12-05 MED ORDER — OMEPRAZOLE 20 MG PO CPDR: DELAYED_RELEASE_CAPSULE | ORAL | Status: DC

## 2014-12-05 MED ORDER — FLUOXETINE HCL 20 MG PO CAPS: ORAL_CAPSULE | ORAL | Status: DC

## 2014-12-05 NOTE — Patient Instructions (Signed)
I sent meds to the pharmacy  If any problems let me know  Tdap vaccine today Take care of yourself

## 2014-12-05 NOTE — Progress Notes (Signed)
Pre visit review using our clinic review tool, if applicable. No additional management support is needed unless otherwise documented below in the visit note. 

## 2014-12-05 NOTE — Progress Notes (Signed)
Subjective:    Patient ID: Natalie Lowe, female    DOB: 11/04/1980, 34 y.o.   MRN: [id number]  HPI Here for f/u of chronic health problem  No new issues   She is resigning from [employer] and going to [employer]   (documentation improvement in hosp)  Used to work at [employer] and the [employer] was hard  Attitude is good - looking forward to it  She will not have to move - will work at home   Mood has been good  Just on prozac now and doing well  Started a group at work for support - for healthy diet and exercise   Needs refill on flonase - still works well for allergy congestion   Had her flu shot in fall   Takes prilosec every day for reflux  Gets GERD symptoms if she misses a dose - also watches her diet    Patient Active Problem List   Diagnosis Date Noted  . Panic disorder 02/14/2014  . Obesity 02/14/2014  . ASD (atrial septal defect) 12/10/2011  . Heart murmur 11/18/2011  . Family history of heart murmur 11/18/2011  . Anxiety 05/20/2011  . GERD 03/28/2010  . DEPRESSION 08/09/2008  . Allergic rhinitis 08/09/2008   Past Medical History  Diagnosis Date  . Depression   . Allergic rhinitis   . Atrial septal defect     with L->R shunt s/p transcatheter ASD closure 12/31/11   Past Surgical History  Procedure Laterality Date  . Knee arthroscopy  2008    right  . Oates procedure  94-97    seven knee signs for torn ACL,menicusal tear  . Asd repair    . Patent foramen ovale closure N/A 12/31/2011    Procedure: PATENT FORAMEN OVALE CLOSURE;  Surgeon: Tonny Bollman, MD;  Location: [facility] CATH LAB;  Service: Cardiovascular;  Laterality: N/A;   History  Substance Use Topics  . Smoking status: Former Smoker    Quit date: 11/02/2000  . Smokeless tobacco: Not on file  . Alcohol Use: 0.0 oz/week    0 Not specified per week     Comment: occasional   Family History  Problem Relation Age of Onset  . Hypertension Mother   . Cancer Maternal Aunt     breast CA   No Known  Allergies Current Outpatient Prescriptions on File Prior to Visit  Medication Sig Dispense Refill  . aspirin 81 MG tablet Take 81 mg by mouth daily.     Marland Kitchen loratadine (CLARITIN) 10 MG tablet Take 10 mg by mouth daily.      . meloxicam (MOBIC) 15 MG tablet Take 1 tablet (15 mg total) by mouth daily. 30 tablet 3  . Norgestim-Eth Estrad Triphasic (ORTHO TRI-CYCLEN, 28, PO) Take 1 tablet by mouth daily.     No current facility-administered medications on file prior to visit.    Review of Systems Review of Systems  Constitutional: Negative for fever, appetite change, fatigue and unexpected weight change.  Eyes: Negative for pain and visual disturbance.  ENT pos for intermittent cong and rhinorrhea  Respiratory: Negative for cough and shortness of breath.   Cardiovascular: Negative for cp or palpitations    Gastrointestinal: Negative for nausea, diarrhea and constipation. neg for heartburn  Genitourinary: Negative for urgency and frequency.  Skin: Negative for pallor or rash   Neurological: Negative for weakness, light-headedness, numbness and headaches.  Hematological: Negative for adenopathy. Does not bruise/bleed easily.  Psychiatric/Behavioral: Negative for dysphoric mood. The patient is not  nervous/anxious.  (as long as she takes her prozac)       Objective:   Physical Exam  Constitutional: She appears well-developed and well-nourished. No distress.  HENT:  Head: Normocephalic and atraumatic.  Right Ear: External ear normal.  Left Ear: External ear normal.  Nose: Nose normal.  Mouth/Throat: Oropharynx is clear and moist.  Boggy nares   Eyes: Conjunctivae and EOM are normal. Pupils are equal, round, and reactive to light. Right eye exhibits no discharge. Left eye exhibits no discharge. No scleral icterus.  Neck: Normal range of motion. Neck supple. No JVD present. Carotid bruit is not present. No thyromegaly present.  Cardiovascular: Normal rate, regular rhythm, normal heart sounds  and intact distal pulses.  Exam reveals no gallop.   Pulmonary/Chest: Effort normal and breath sounds normal. No respiratory distress. She has no wheezes. She has no rales.  Abdominal: Soft. Bowel sounds are normal. She exhibits no distension and no mass. There is no tenderness.  Musculoskeletal: She exhibits no edema or tenderness.  Lymphadenopathy:    She has no cervical adenopathy.  Neurological: She is alert. She has normal reflexes. No cranial nerve deficit. She exhibits normal muscle tone. Coordination normal.  Skin: Skin is warm and dry. No rash noted. No erythema. No pallor.  Psychiatric: She has a normal mood and affect.         Assessment & Plan:   Problem List Items Addressed This Visit      Respiratory   Allergic rhinitis    Refilled flonase  She uses this year around          Other   Anxiety - Primary    Hx of anx with depression  Continues to take prozac which works well  She has not tolerated coming off of it /symptoms return Reviewed stressors/ coping techniques/symptoms/ support sources/ tx options and side effects in detail today Disc role of exercise Med refilled       Relevant Medications   FLUoxetine (PROZAC) capsule    Other Visit Diagnoses    Need for diphtheria-tetanus-pertussis (Tdap) vaccine, adult/adolescent        Relevant Orders    Tdap vaccine greater than or equal to 7yo IM (Completed)

## 2014-12-09 NOTE — Assessment & Plan Note (Signed)
Hx of anx with depression  Continues to take prozac which works well  She has not tolerated coming off of it /symptoms return Reviewed stressors/ coping techniques/symptoms/ support sources/ tx options and side effects in detail today Disc role of exercise Med refilled

## 2014-12-09 NOTE — Assessment & Plan Note (Signed)
Refilled flonase  She uses this year around

## 2015-03-04 ENCOUNTER — Ambulatory Visit (INDEPENDENT_AMBULATORY_CARE_PROVIDER_SITE_OTHER): Payer: Managed Care, Other (non HMO) | Admitting: Family Medicine

## 2015-03-04 ENCOUNTER — Encounter: Payer: Self-pay | Admitting: Family Medicine

## 2015-03-04 VITALS — BP 124/72 | HR 71 | Temp 98.6°F | Ht 64.25 in | Wt 182.2 lb

## 2015-03-04 DIAGNOSIS — S29012A Strain of muscle and tendon of back wall of thorax, initial encounter: Secondary | ICD-10-CM | POA: Diagnosis not present

## 2015-03-04 MED ORDER — CYCLOBENZAPRINE HCL 10 MG PO TABS: 10.0000 mg | ORAL_TABLET | Freq: Three times a day (TID) | ORAL | Status: DC | PRN

## 2015-03-04 NOTE — Progress Notes (Signed)
Subjective:    Patient ID: Natalie Lowe, female    DOB: 11-09-1980, 34 y.o.   MRN: [id number]  HPI Here with neck pain   This is recurrent  Tends to flare about 3 times per year  Used to go to employee clinic at [employer]-would be given flexeril   This flare started 3 weeks ago - did not get better as quickly  Dog yanked her on leash and it started quickly  Very stiff/painful  Just not getting much better  L side -hurts around scapula up until neck   Heat/ heat patch/advil/tylenol / and massager  Found some old flexeril -helped some but not as much  No pain arms -no n/t or grip problems    Patient Active Problem List   Diagnosis Date Noted  . Panic disorder 02/14/2014  . Obesity 02/14/2014  . ASD (atrial septal defect) 12/10/2011  . Heart murmur 11/18/2011  . Family history of heart murmur 11/18/2011  . Anxiety 05/20/2011  . GERD 03/28/2010  . DEPRESSION 08/09/2008  . Allergic rhinitis 08/09/2008   Past Medical History  Diagnosis Date  . Depression   . Allergic rhinitis   . Atrial septal defect     with L->R shunt s/p transcatheter ASD closure 12/31/11   Past Surgical History  Procedure Laterality Date  . Knee arthroscopy  2008    right  . Oates procedure  94-97    seven knee signs for torn ACL,menicusal tear  . Asd repair    . Patent foramen ovale closure N/A 12/31/2011    Procedure: PATENT FORAMEN OVALE CLOSURE;  Surgeon: Tonny Bollman, MD;  Location: [facility] CATH LAB;  Service: Cardiovascular;  Laterality: N/A;   History  Substance Use Topics  . Smoking status: Former Smoker    Quit date: 11/02/2000  . Smokeless tobacco: Not on file  . Alcohol Use: 0.0 oz/week    0 Standard drinks or equivalent per week     Comment: occasional   Family History  Problem Relation Age of Onset  . Hypertension Mother   . Cancer Maternal Aunt     breast CA   No Known Allergies Current Outpatient Prescriptions on File Prior to Visit  Medication Sig Dispense Refill  . aspirin  81 MG tablet Take 81 mg by mouth daily.     Marland Kitchen FLUoxetine (PROZAC) 20 MG capsule Take 1 tablet (20 mg total) by mouth daily. 90 capsule 3  . fluticasone (FLONASE) 50 MCG/ACT nasal spray Place 2 sprays into the nose daily. 48 g 3  . loratadine (CLARITIN) 10 MG tablet Take 10 mg by mouth daily.      Lorita Officer Triphasic (ORTHO TRI-CYCLEN, 28, PO) Take 1 tablet by mouth daily.    Marland Kitchen omeprazole (PRILOSEC) 20 MG capsule Take 1 capsule (20 mg total) by mouth daily. 90 capsule 3   No current facility-administered medications on file prior to visit.     Review of Systems Review of Systems  Constitutional: Negative for fever, appetite change, fatigue and unexpected weight change.  Eyes: Negative for pain and visual disturbance.  Respiratory: Negative for cough and shortness of breath.   Cardiovascular: Negative for cp or palpitations    Gastrointestinal: Negative for nausea, diarrhea and constipation.  Genitourinary: Negative for urgency and frequency.  Skin: Negative for pallor or rash   MSK pos for neck and L shoulder blade pain, neg for joint swelling  Neurological: Negative for weakness, light-headedness, numbness and headaches.  Hematological: Negative for adenopathy. Does  not bruise/bleed easily.  Psychiatric/Behavioral: Negative for dysphoric mood. The patient is not nervous/anxious.         Objective:   Physical Exam  Constitutional: She appears well-developed and well-nourished. No distress.  overwt and well app  HENT:  Head: Normocephalic.  Mouth/Throat: Oropharynx is clear and moist.  Eyes: Conjunctivae and EOM are normal. Pupils are equal, round, and reactive to light.  Neck: Neck supple.  Pain on full flex and L rotation No spinal tenderness   Cardiovascular: Normal rate and regular rhythm.   Pulmonary/Chest: Effort normal and breath sounds normal.  Musculoskeletal: She exhibits tenderness. She exhibits no edema.  Tender medial to L scapula and over L cervical  musculature and trapezius with spasm   Lymphadenopathy:    She has no cervical adenopathy.  Neurological: She is alert. She has normal reflexes. No cranial nerve deficit. She exhibits normal muscle tone. Coordination normal.  Skin: Skin is warm and dry. No rash noted. No erythema.  Psychiatric: She has a normal mood and affect.          Assessment & Plan:   Problem List Items Addressed This Visit      Musculoskeletal and Integument   Rhomboid muscle strain - Primary    With spasm Pain from neck down  Taught rhomboid stretch that was helpful  Heat/nsaid/flexeril  Ref to PT given duration Disc use of good supportive pillow as well       Relevant Orders   Ambulatory referral to Physical Therapy

## 2015-03-04 NOTE — Patient Instructions (Signed)
Do there rhomboid stretch I taught you  Continue massage and heat  Flexeril as needed- careful of sedation  nsaid like ibuprofen as needed -with food  Stop at check out for ref to PT

## 2015-03-04 NOTE — Assessment & Plan Note (Signed)
With spasm Pain from neck down  Taught rhomboid stretch that was helpful  Heat/nsaid/flexeril  Ref to PT given duration Disc use of good supportive pillow as well

## 2015-03-04 NOTE — Progress Notes (Signed)
Pre visit review using our clinic review tool, if applicable. No additional management support is needed unless otherwise documented below in the visit note. 

## 2015-07-05 LAB — HM PAP SMEAR: HM PAP: NORMAL

## 2015-11-27 ENCOUNTER — Other Ambulatory Visit: Payer: Self-pay | Admitting: Family Medicine

## 2015-11-27 NOTE — Telephone Encounter (Signed)
Please schedule spring f/u and refill until then 

## 2015-11-27 NOTE — Telephone Encounter (Signed)
New pharmacy requesting refill of meds, last OV was an acute appt on 03/04/15 and no future appt., please advise

## 2015-11-27 NOTE — Telephone Encounter (Signed)
appt scheduled and meds refilled 

## 2016-02-18 ENCOUNTER — Ambulatory Visit (INDEPENDENT_AMBULATORY_CARE_PROVIDER_SITE_OTHER): Payer: Managed Care, Other (non HMO) | Admitting: Family Medicine

## 2016-02-18 ENCOUNTER — Encounter: Payer: Self-pay | Admitting: Family Medicine

## 2016-02-18 VITALS — BP 102/66 | HR 67 | Temp 98.8°F | Ht 64.25 in | Wt 192.5 lb

## 2016-02-18 DIAGNOSIS — E669 Obesity, unspecified: Secondary | ICD-10-CM | POA: Diagnosis not present

## 2016-02-18 DIAGNOSIS — E785 Hyperlipidemia, unspecified: Secondary | ICD-10-CM | POA: Diagnosis not present

## 2016-02-18 DIAGNOSIS — F419 Anxiety disorder, unspecified: Secondary | ICD-10-CM

## 2016-02-18 DIAGNOSIS — K219 Gastro-esophageal reflux disease without esophagitis: Secondary | ICD-10-CM

## 2016-02-18 MED ORDER — FLUTICASONE PROPIONATE 50 MCG/ACT NA SUSP: NASAL | Status: DC

## 2016-02-18 MED ORDER — OMEPRAZOLE 20 MG PO CPDR: 20.0000 mg | DELAYED_RELEASE_CAPSULE | Freq: Every day | ORAL | Status: DC

## 2016-02-18 MED ORDER — FLUOXETINE HCL 20 MG PO CAPS: 20.0000 mg | ORAL_CAPSULE | Freq: Every day | ORAL | Status: DC

## 2016-02-18 NOTE — Patient Instructions (Signed)
Keep working on [employer] diet with portion control  Also exercise and outdoor time No change in medicines  Schedule fasting labs on the way out  Take care of yourself

## 2016-02-18 NOTE — Assessment & Plan Note (Signed)
Discussed how this problem influences overall health and the risks it imposes  Reviewed plan for weight loss with lower calorie diet (via better food choices and also portion control or program like weight watchers) and exercise building up to or more than 30 minutes 5 days per week including some aerobic activity   Pt plans to begin reducing portion sizes gradually

## 2016-02-18 NOTE — Assessment & Plan Note (Signed)
Lipid panel today Also has fam hx of hyperlipidemia Disc low sat fat diet

## 2016-02-18 NOTE — Progress Notes (Signed)
Pre visit review using our clinic review tool, if applicable. No additional management support is needed unless otherwise documented below in the visit note. 

## 2016-02-18 NOTE — Progress Notes (Signed)
Subjective:    Patient ID: Natalie Lowe, female    DOB: [birth year], 35 y.o.   MRN: [id number]  HPI Here for f/u of chronic health problems   Doing very well   Working a lot overall  Also has 11 acres - a lot of outdoor time -that is good   Taking care of herself  Eats too much - but healthy choices  Exercises a lot   Does not buy junk food   Feeling pretty good mentally  Struggles with social anxiety - but it does not freeze her up in her tracks  No more panic attacks   She tries to prepare herself when she needs to speak in public   Still using flonase - worse allergies in the fall than the spring   Omeprazole - 20 mg /has not tried to come off of it  She had sob off of it    Patient Active Problem List   Diagnosis Date Noted  . Hyperlipidemia 02/18/2016  . Rhomboid muscle strain 03/04/2015  . Panic disorder 02/14/2014  . Obesity 02/14/2014  . ASD (atrial septal defect) 12/10/2011  . Heart murmur 11/18/2011  . Family history of heart murmur 11/18/2011  . Anxiety 05/20/2011  . GERD 03/28/2010  . DEPRESSION 08/09/2008  . Allergic rhinitis 08/09/2008   Past Medical History  Diagnosis Date  . Depression   . Allergic rhinitis   . Atrial septal defect     with L->R shunt s/p transcatheter ASD closure 12/31/11   Past Surgical History  Procedure Laterality Date  . Knee arthroscopy  2008    right  . Oates procedure  94-97    seven knee signs for torn ACL,menicusal tear  . Asd repair    . Patent foramen ovale closure N/A 12/31/2011    Procedure: PATENT FORAMEN OVALE CLOSURE;  Surgeon: Tonny BollmanMichael Cooper, MD;  [facility];  Service: Cardiovascular;  Laterality: N/A;   Social History  Substance Use Topics  . Smoking status: Former Smoker    Quit date: 11/02/2000  . Smokeless tobacco: None  . Alcohol Use: 0.0 oz/week    0 Standard drinks or equivalent per week     Comment: occasional   Family History  Problem Relation Age of Onset  . Hypertension  Mother   . Cancer Maternal Aunt     breast CA   No Known Allergies Current Outpatient Prescriptions on File Prior to Visit  Medication Sig Dispense Refill  . aspirin 81 MG tablet Take 81 mg by mouth daily.     Marland Kitchen. loratadine (CLARITIN) 10 MG tablet Take 10 mg by mouth daily.      Lorita Officer. Norgestim-Eth Estrad Triphasic (ORTHO TRI-CYCLEN, 28, PO) Take 1 tablet by mouth daily.     No current facility-administered medications on file prior to visit.    Review of Systems Review of Systems  Constitutional: Negative for fever, appetite change, fatigue and unexpected weight change.  Eyes: Negative for pain and visual disturbance.  Respiratory: Negative for cough and shortness of breath.   Cardiovascular: Negative for cp or palpitations    Gastrointestinal: Negative for nausea, diarrhea and constipation.  Genitourinary: Negative for urgency and frequency.  Skin: Negative for pallor or rash   Neurological: Negative for weakness, light-headedness, numbness and headaches.  Hematological: Negative for adenopathy. Does not bruise/bleed easily.  Psychiatric/Behavioral: Negative for dysphoric mood. The patient is occ nervous/anxious.         Objective:   Physical Exam  Constitutional: She  appears well-developed and well-nourished. No distress.  overwt and well appearing   HENT:  Head: Normocephalic and atraumatic.  Mouth/Throat: Oropharynx is clear and moist.  Eyes: Conjunctivae and EOM are normal. Pupils are equal, round, and reactive to light.  Neck: Normal range of motion. Neck supple. No JVD present. Carotid bruit is not present. No thyromegaly present.  Cardiovascular: Normal rate, regular rhythm and intact distal pulses.  Exam reveals no gallop.   Murmur heard. Pulmonary/Chest: Effort normal and breath sounds normal. No respiratory distress. She has no wheezes. She has no rales.  No crackles  Abdominal: Soft. Bowel sounds are normal. She exhibits no distension, no abdominal bruit and no mass.  There is no tenderness.  Musculoskeletal: She exhibits tenderness. She exhibits no edema.  Lymphadenopathy:    She has no cervical adenopathy.  Neurological: She is alert. She has normal reflexes.  Skin: Skin is warm and dry. No rash noted.  Solar lentigines noted  Psychiatric: She has a normal mood and affect. Her speech is normal and behavior is normal. Thought content normal. Her mood appears not anxious. Her affect is not blunt and not labile. Thought content is not paranoid. Cognition and memory are normal. She does not exhibit a depressed mood. She expresses no homicidal and no suicidal ideation.  Bright affect today          Assessment & Plan:   Problem List Items Addressed This Visit      Digestive   GERD    Doing well on omeprazole  Pt has respiratory symptoms off of it  Disc diet for gerd  Enc wt loss       Relevant Medications   omeprazole (PRILOSEC) 20 MG capsule   Other Relevant Orders   CBC with Differential/Platelet     Other   Anxiety - Primary    Overall doing better on prozac Reviewed stressors/ coping techniques/symptoms/ support sources/ tx options and side effects in detail today  Public speaking is still a stressor- but she is doing ok  No panic attacks Not currently in counseling and does not desire a referral  Will continue to monitor Enc exercise and outdoor time      Relevant Medications   FLUoxetine (PROZAC) 20 MG capsule   Other Relevant Orders   TSH   Hyperlipidemia    Lipid panel today Also has fam hx of hyperlipidemia Disc low sat fat diet       Relevant Orders   Comprehensive metabolic panel   Lipid panel   Obesity    Discussed how this problem influences overall health and the risks it imposes  Reviewed plan for weight loss with lower calorie diet (via better food choices and also portion control or program like weight watchers) and exercise building up to or more than 30 minutes 5 days per week including some aerobic activity    Pt plans to begin reducing portion sizes gradually

## 2016-02-18 NOTE — Assessment & Plan Note (Signed)
Overall doing better on prozac Reviewed stressors/ coping techniques/symptoms/ support sources/ tx options and side effects in detail today  Public speaking is still a stressor- but she is doing ok  No panic attacks Not currently in counseling and does not desire a referral  Will continue to monitor Enc exercise and outdoor time

## 2016-02-18 NOTE — Assessment & Plan Note (Signed)
Doing well on omeprazole  Pt has respiratory symptoms off of it  Disc diet for gerd  Enc wt loss

## 2016-02-25 ENCOUNTER — Other Ambulatory Visit: Payer: Managed Care, Other (non HMO)

## 2016-02-28 ENCOUNTER — Other Ambulatory Visit: Payer: Managed Care, Other (non HMO)

## 2016-08-03 LAB — HM PAP SMEAR: HM Pap smear: NORMAL

## 2017-03-12 ENCOUNTER — Other Ambulatory Visit: Payer: Self-pay | Admitting: *Deleted

## 2017-03-12 NOTE — Telephone Encounter (Signed)
Fax refill request, pt hasn't been seen in over a year, and no future appts., please advise

## 2017-03-14 NOTE — Telephone Encounter (Signed)
Please schedule a summer f/u and refill until then 

## 2017-03-15 MED ORDER — OMEPRAZOLE 20 MG PO CPDR: 20.0000 mg | DELAYED_RELEASE_CAPSULE | Freq: Every day | ORAL | 0 refills | Status: DC

## 2017-03-15 MED ORDER — FLUOXETINE HCL 20 MG PO CAPS: 20.0000 mg | ORAL_CAPSULE | Freq: Every day | ORAL | 0 refills | Status: DC

## 2017-03-15 NOTE — Telephone Encounter (Signed)
Spoke with patient and follow-up appointment scheduled for 05/11/17. Refill sent to pharmacy as instructed.

## 2017-04-28 ENCOUNTER — Other Ambulatory Visit: Payer: Self-pay | Admitting: Family Medicine

## 2017-04-28 NOTE — Addendum Note (Signed)
Addended by: Shon MilletWATLINGTON, Benigna Delisi M on: 04/28/2017 12:01 PM   Modules accepted: Orders

## 2017-05-11 ENCOUNTER — Ambulatory Visit (INDEPENDENT_AMBULATORY_CARE_PROVIDER_SITE_OTHER): Payer: Managed Care, Other (non HMO) | Admitting: Family Medicine

## 2017-05-11 ENCOUNTER — Encounter: Payer: Self-pay | Admitting: Family Medicine

## 2017-05-11 VITALS — BP 126/68 | HR 64 | Temp 98.3°F | Ht 64.25 in

## 2017-05-11 DIAGNOSIS — E6609 Other obesity due to excess calories: Secondary | ICD-10-CM

## 2017-05-11 DIAGNOSIS — F410 Panic disorder [episodic paroxysmal anxiety] without agoraphobia: Secondary | ICD-10-CM | POA: Diagnosis not present

## 2017-05-11 DIAGNOSIS — K219 Gastro-esophageal reflux disease without esophagitis: Secondary | ICD-10-CM | POA: Diagnosis not present

## 2017-05-11 DIAGNOSIS — Z6831 Body mass index (BMI) 31.0-31.9, adult: Secondary | ICD-10-CM

## 2017-05-11 DIAGNOSIS — F419 Anxiety disorder, unspecified: Secondary | ICD-10-CM

## 2017-05-11 DIAGNOSIS — E7800 Pure hypercholesterolemia, unspecified: Secondary | ICD-10-CM

## 2017-05-11 MED ORDER — FLUTICASONE PROPIONATE 50 MCG/ACT NA SUSP: NASAL | 3 refills | Status: DC

## 2017-05-11 MED ORDER — FLUOXETINE HCL 20 MG PO CAPS: 20.0000 mg | ORAL_CAPSULE | Freq: Every day | ORAL | 3 refills | Status: DC

## 2017-05-11 MED ORDER — OMEPRAZOLE 20 MG PO CPDR: 20.0000 mg | DELAYED_RELEASE_CAPSULE | Freq: Every day | ORAL | 3 refills | Status: DC

## 2017-05-11 NOTE — Assessment & Plan Note (Signed)
-   she reacts strongly to her husband's medical procedures and will avoid them  Reviewed stressors/ coping techniques/symptoms/ support sources/ tx options and side effects in detail today Continue prozac that works well for her  Disc imp of self care /diet/exercise

## 2017-05-11 NOTE — Assessment & Plan Note (Signed)
Discussed how this problem influences overall health and the risks it imposes  Reviewed plan for weight loss with lower calorie diet (via better food choices and also portion control or program like weight watchers) and exercise building up to or more than 30 minutes 5 days per week including some aerobic activity   Commended effort and plan for wt loss with the myfitnesspal app

## 2017-05-11 NOTE — Assessment & Plan Note (Signed)
Well controlled with prozac Reviewed stressors/ coping techniques/symptoms/ support sources/ tx options and side effects in detail today

## 2017-05-11 NOTE — Patient Instructions (Addendum)
No change in medicines  Labs for cholesterol today   Keep working on diet/exercise with myfitness pal   Take care of yourself

## 2017-05-11 NOTE — Assessment & Plan Note (Signed)
Disc goals for lipids and reasons to control them Rev labs with pt (from last visit)  Diet is fair and improving  Lab today for lipid panel Rev low sat fat diet in detail

## 2017-05-11 NOTE — Assessment & Plan Note (Signed)
Doing well with daily omeprazole and diet discretion She is symptomatic with missed doses  Refilled  Disc GERD diet and plan for wt loss

## 2017-05-11 NOTE — Progress Notes (Signed)
Subjective:    Patient ID: Natalie Lowe, female    DOB: [birth year], 36 y.o.   MRN: [id number]  HPI  Here for f/u of chronic medical problems   Has been doing well  Went on a cruise last week with a big group - had fun   Taking care of herself with healthy diet and exercise (except on vacation)  Uses myfitness pal   She did not weigh today-working hard on weight loss   Takes omeprazole for GERD Doing well on this - she gets rebound heartburn if she misses doses  She has to be careful with prepared tomato products   Hx of hyperlipidemia Lab Results  Component Value Date   CHOL 198 08/09/2008   HDL 69.6 08/09/2008   LDLCALC 107 (H) 08/09/2008   TRIG 107 08/09/2008   CHOLHDL 2.8 CALC 08/09/2008    Hx of panic disorder and depression in the past She has had some issues with panic attacks when her husband has medical procedures -once she passed out  She plans to avoid any medical procedures with him (she is a Engineer, 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 Hyperlipidemia 02/18/2016  . Rhomboid muscle strain 03/04/2015  . Panic disorder 02/14/2014  . Obesity 02/14/2014  . ASD (atrial septal defect) 12/10/2011  . Heart murmur 11/18/2011  . Family history of heart murmur 11/18/2011  . Anxiety 05/20/2011  . GERD 03/28/2010  . Allergic rhinitis 08/09/2008   Past Medical History:  Diagnosis Date  . Allergic rhinitis   . Atrial septal defect    with L->R shunt s/p transcatheter ASD closure 12/31/11  . Depression    Past Surgical History:  Procedure Laterality Date  . ASD REPAIR    . KNEE ARTHROSCOPY  2008   right  . OATES PROCEDURE  94-97   seven knee signs for torn ACL,menicusal tear  . PATENT FORAMEN OVALE CLOSURE N/A 12/31/2011   Procedure: PATENT FORAMEN OVALE CLOSURE;  Surgeon: Tonny Bollman, MD;   Location: [facility] CATH LAB;  Service: Cardiovascular;  Laterality: N/A;   Social History  Substance Use Topics  . Smoking status: Former Smoker    Quit date: 11/02/2000  . Smokeless tobacco: Never Used  . Alcohol use 0.0 oz/week     Comment: occasional   Family History  Problem Relation Age of Onset  . Hypertension Mother   . Cancer Maternal Aunt        breast CA   No Known Allergies Current Outpatient Prescriptions on File Prior to Visit  Medication Sig Dispense Refill  . aspirin 81 MG tablet Take 81 mg by mouth daily.     Marland Kitchen loratadine (CLARITIN) 10 MG tablet Take 10 mg by mouth daily.      Lorita Officer Triphasic (ORTHO TRI-CYCLEN, 28, PO) Take 1 tablet by mouth daily.     No current facility-administered medications on file prior to visit.     Review of Systems Review of Systems  Constitutional: Negative for fever, appetite change, fatigue and unexpected weight change.  Eyes: Negative for pain and visual disturbance.  Respiratory: Negative for cough and shortness of breath.   Cardiovascular: Negative for cp or palpitations    Gastrointestinal: Negative for nausea, diarrhea and constipation. neg for heartburn unless she misses her ppi dose or eats tomato  products  Genitourinary: Negative for urgency and frequency.  Skin: Negative for pallor or rash   Neurological: Negative for weakness, light-headedness, numbness and headaches.  Hematological: Negative for adenopathy. Does not bruise/bleed easily.  Psychiatric/Behavioral: Negative for dysphoric mood. The patient is not nervous/anxious.         Objective:   Physical Exam  Constitutional: She appears well-developed and well-nourished. No distress.  overwt and well appearing   HENT:  Head: Normocephalic and atraumatic.  Right Ear: External ear normal.  Left Ear: External ear normal.  Nose: Nose normal.  Mouth/Throat: Oropharynx is clear and moist.  Eyes: Conjunctivae and EOM are normal. Pupils are equal, round, and  reactive to light. No scleral icterus.  Neck: Normal range of motion. Neck supple. No JVD present. Carotid bruit is not present. No thyromegaly present.  Cardiovascular: Normal rate, regular rhythm, normal heart sounds and intact distal pulses.  Exam reveals no gallop.   Pulmonary/Chest: Effort normal and breath sounds normal. No respiratory distress. She has no wheezes. She has no rales.  No crackles  Abdominal: Soft. Bowel sounds are normal. She exhibits no distension, no abdominal bruit and no mass. There is no tenderness. There is no rebound and no guarding.  Musculoskeletal: She exhibits no edema.  Lymphadenopathy:    She has no cervical adenopathy.  Neurological: She is alert. She has normal reflexes. She displays no tremor. No cranial nerve deficit. She exhibits normal muscle tone. Coordination normal.  Skin: Skin is warm and dry. No rash noted. No erythema. No pallor.  Solar lentigines diffusely   Psychiatric: She has a normal mood and affect. Her speech is normal and behavior is normal. Thought content normal. Her mood appears not anxious. She does not exhibit a depressed mood.  Cheerful and talkative          Assessment & Plan:   Problem List Items Addressed This Visit      Digestive   GERD - Primary    Doing well with daily omeprazole and diet discretion She is symptomatic with missed doses  Refilled  Disc GERD diet and plan for wt loss       Relevant Medications   omeprazole (PRILOSEC) 20 MG capsule     Other   Anxiety    Well controlled with prozac Reviewed stressors/ coping techniques/symptoms/ support sources/ tx options and side effects in detail today        Relevant Medications   FLUoxetine (PROZAC) 20 MG capsule   Hyperlipidemia    Disc goals for lipids and reasons to control them Rev labs with pt (from last visit)  Diet is fair and improving  Lab today for lipid panel Rev low sat fat diet in detail       Relevant Orders   Lipid panel    Obesity    Discussed how this problem influences overall health and the risks it imposes  Reviewed plan for weight loss with lower calorie diet (via better food choices and also portion control or program like weight watchers) and exercise building up to or more than 30 minutes 5 days per week including some aerobic activity   Commended effort and plan for wt loss with the myfitnesspal app      Panic disorder    - she reacts strongly to her husband's medical procedures and will avoid them  Reviewed stressors/ coping techniques/symptoms/ support sources/ tx options and side effects in detail today Continue prozac that works well for her  Disc imp of self  care /diet/exercise

## 2017-05-12 LAB — LIPID PANEL
Cholesterol: 221 mg/dL — ABNORMAL HIGH (ref 0–200)
HDL: 92.3 mg/dL (ref >39.00)
LDL CALC: 99 mg/dL (ref 0–99)
NONHDL: 128.84
Total CHOL/HDL Ratio: 2
Triglycerides: 150 mg/dL — ABNORMAL HIGH (ref 0.0–149.0)
VLDL: 30 mg/dL (ref 0.0–40.0)

## 2018-05-16 ENCOUNTER — Ambulatory Visit (INDEPENDENT_AMBULATORY_CARE_PROVIDER_SITE_OTHER): Payer: Managed Care, Other (non HMO) | Admitting: Family Medicine

## 2018-05-16 ENCOUNTER — Encounter: Payer: Self-pay | Admitting: Family Medicine

## 2018-05-16 VITALS — BP 132/78 | HR 77 | Temp 98.2°F | Ht 64.0 in | Wt 199.8 lb

## 2018-05-16 DIAGNOSIS — Z111 Encounter for screening for respiratory tuberculosis: Secondary | ICD-10-CM

## 2018-05-16 DIAGNOSIS — F419 Anxiety disorder, unspecified: Secondary | ICD-10-CM

## 2018-05-16 DIAGNOSIS — N62 Hypertrophy of breast: Secondary | ICD-10-CM | POA: Diagnosis not present

## 2018-05-16 DIAGNOSIS — E7800 Pure hypercholesterolemia, unspecified: Secondary | ICD-10-CM

## 2018-05-16 DIAGNOSIS — F410 Panic disorder [episodic paroxysmal anxiety] without agoraphobia: Secondary | ICD-10-CM

## 2018-05-16 DIAGNOSIS — Z0000 Encounter for general adult medical examination without abnormal findings: Secondary | ICD-10-CM | POA: Diagnosis not present

## 2018-05-16 DIAGNOSIS — E669 Obesity, unspecified: Secondary | ICD-10-CM

## 2018-05-16 DIAGNOSIS — E663 Overweight: Secondary | ICD-10-CM | POA: Insufficient documentation

## 2018-05-16 MED ORDER — OMEPRAZOLE 20 MG PO CPDR: 20.0000 mg | DELAYED_RELEASE_CAPSULE | Freq: Every day | ORAL | 3 refills | Status: DC

## 2018-05-16 MED ORDER — FLUTICASONE PROPIONATE 50 MCG/ACT NA SUSP: NASAL | 3 refills | Status: DC

## 2018-05-16 MED ORDER — FLUOXETINE HCL 20 MG PO CAPS: 20.0000 mg | ORAL_CAPSULE | Freq: Every day | ORAL | 3 refills | Status: DC

## 2018-05-16 NOTE — Assessment & Plan Note (Signed)
Causes back and neck pain  No plans to have children Interested in breast reduction in the future  Will call for ref if needed

## 2018-05-16 NOTE — Assessment & Plan Note (Signed)
Well controlled with fluoxetine

## 2018-05-16 NOTE — Assessment & Plan Note (Signed)
Doing well with this and panic disorder with fluoxetine Has tendency to get PVCs if very nervous/ not often Will watch this  Reviewed stressors/ coping techniques/symptoms/ support sources/ tx options and side effects in detail today

## 2018-05-16 NOTE — Patient Instructions (Addendum)
Minimize caffeine as much as possible - transition to decaf gradually   PPD today   Keep up the good work with diet/exercise and self care  Good luck finishing school   If palpitations (PVCs) worsen- we will get you back to cardiology

## 2018-05-16 NOTE — Assessment & Plan Note (Signed)
Discussed how this problem influences overall health and the risks it imposes  Reviewed plan for weight loss with lower calorie diet (via better food choices and also portion control or program like weight watchers) and exercise building up to or more than 30 minutes 5 days per week including some aerobic activity    

## 2018-05-16 NOTE — Assessment & Plan Note (Signed)
Last lipid profile was good Disc goals for lipids and reasons to control them Rev last labs with pt Rev low sat fat diet in detail Excellent hdl at 92.3 Enc healthy diet

## 2018-05-16 NOTE — Assessment & Plan Note (Signed)
Reviewed health habits including diet and exercise and skin cancer prevention Reviewed appropriate screening tests for age  Also reviewed health mt list, fam hx and immunization status , as well as social and family history   See HPI Labs reviewed from last year  Good health habits  Rev pap report  Enc self breast exams and regular gyn visits

## 2018-05-16 NOTE — Progress Notes (Signed)
Subjective:    Patient ID: Natalie Lowe, female    DOB: 01/01/1981, 37 y.o.   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   Hx of ASD repair  Has occ skipped beats (pvc on strip)  Only during high stress  Notices more when she is quiet  If worsens -will go back to cardiology  Does drink caffeine   Wt Readings from Last 3 Encounters:  05/16/18 199 lb 12 oz (90.6 kg)  02/18/16 192 lb 8 oz (87.3 kg)  03/04/15 182 lb 4 oz (82.7 kg)  eating healthy  Getting enough exercise  3-4 days per week- machines/cardio  She is eating healthy  -using the nutrasystem guidelines at home w/o ordering the food  34.29 kg/m  Flu shot -had one in the fall   Needs PPD skin test for school today  Is planning a breast reduction in the future  Plans to make an appt with plastic surgery   Pap 10/17 gyn normal  Well woman exam at gyn 10/18 with pap and hpv test  Nl result Care Everywhere Result Report IGP, HPV, Aptima High-RiskResulted: 08/26/2017 5:40 AM [facility] Component Name Value Ref Range  Gyn Interpretation Comment  Comment: NEGATIVE FOR INTRAEPITHELIAL LESION AND MALIGNANCY.   Specimen Adequacy: Comment  Comment: Satisfactory for evaluation. Endocervical and/or squamous metaplastic cells (endocervical component) are present.   Clinician provided ICD10 Comment  Comment: Z12.4   Performed By: Comment  Comment: Ladora Daniel, Cytotechnologist (ASCP)   Microscopic Observation .   Gyn Note Comment  Comment: The Pap smear is a screening test designed to aid in the detection of premalignant and malignant conditions of the uterine cervix.It is not a diagnostic procedure and should not be used as the sole means of detecting cervical cancer.Both false-positive and false-negative  reports do occur.   Test Methodology Comment  Comment: This liquid based ThinPrep(R) pap test was screened with the use of an image guided system.   HPV Aptima Negative  Comment: This test detects fourteen high-risk HPV types (16/18/31/33/35/39/45/ 51/52/56/58/59/66/68) without differentiation. Negative   Specimen Collected on  Genital 08/24/2017 11:33 AM      Tetanus shot 2/16  Breast cancer screening Turns 38 in jan  Self breast exam -no lumps    Cholesterol -done a year ago-very good  Lab Results  Component Value Date   CHOL 221 (H) 05/11/2017   HDL 92.30 05/11/2017   LDLCALC 99 05/11/2017   TRIG 150.0 (H) 05/11/2017   CHOLHDL 2 05/11/2017   Not fasting  Does not want to do a lab panel today    Anxiety/panic -doing much better  No medical proceedures for she or husband - (her stressors are less) Still taking fluoxetine   Patient Active Problem List   Diagnosis Date Noted  . Obesity (BMI 30-39.9) 05/16/2018  . Routine general medical examination at a health care facility 05/16/2018  . Large breasts 05/16/2018  . Hyperlipidemia 02/18/2016  . Rhomboid muscle strain 03/04/2015  . Panic disorder 02/14/2014  . ASD (atrial septal defect) 12/10/2011  . Heart murmur 11/18/2011  . Family history of heart murmur 11/18/2011  . Anxiety 05/20/2011  . GERD 03/28/2010  . Allergic rhinitis 08/09/2008   Past Medical History:  Diagnosis Date  . Allergic rhinitis   .  Atrial septal defect    with L->R shunt s/p transcatheter ASD closure 12/31/11  . Depression    Past Surgical History:  Procedure Laterality Date  . ASD REPAIR    . KNEE ARTHROSCOPY  2008   right  . OATES PROCEDURE  94-97   seven knee signs for torn ACL,menicusal tear  . PATENT FORAMEN OVALE CLOSURE N/A 12/31/2011   Procedure: PATENT FORAMEN OVALE CLOSURE;  Surgeon: Tonny Bollman, MD;  Location: [facility] CATH LAB;  Service: Cardiovascular;  Laterality: N/A;   Social History   Tobacco Use  . Smoking  status: Former Smoker    Last attempt to quit: 11/02/2000    Years since quitting: 17.5  . Smokeless tobacco: Never Used  Substance Use Topics  . Alcohol use: Yes    Alcohol/week: 0.0 oz    Comment: occasional  . Drug use: No   Family History  Problem Relation Age of Onset  . Hypertension Mother   . Cancer Maternal Aunt        breast CA   No Known Allergies Current Outpatient Medications on File Prior to Visit  Medication Sig Dispense Refill  . aspirin 81 MG tablet Take 81 mg by mouth daily.     . Glucosamine HCl 1500 MG TABS Take 1 tablet by mouth 2 (two) times daily.    Marland Kitchen loratadine (CLARITIN) 10 MG tablet Take 10 mg by mouth daily.      Lorita Officer Triphasic (ORTHO TRI-CYCLEN, 28, PO) Take 1 tablet by mouth daily.    . TURMERIC PO Take 1 tablet by mouth 2 (two) times daily.     No current facility-administered medications on file prior to visit.      Review of Systems  Constitutional: Negative for activity change, appetite change, fatigue, fever and unexpected weight change.  HENT: Negative for congestion, ear pain, rhinorrhea, sinus pressure and sore throat.   Eyes: Negative for pain, redness and visual disturbance.  Respiratory: Negative for cough, shortness of breath and wheezing.   Cardiovascular: Negative for chest pain and palpitations.  Gastrointestinal: Negative for abdominal pain, blood in stool, constipation and diarrhea.  Endocrine: Negative for polydipsia and polyuria.  Genitourinary: Negative for dysuria, frequency and urgency.  Musculoskeletal: Positive for back pain. Negative for arthralgias and myalgias.  Skin: Negative for pallor and rash.  Allergic/Immunologic: Negative for environmental allergies.  Neurological: Negative for dizziness, syncope and headaches.  Hematological: Negative for adenopathy. Does not bruise/bleed easily.  Psychiatric/Behavioral: Negative for decreased concentration and dysphoric mood. The patient is not nervous/anxious.         Objective:   Physical Exam  Constitutional: She appears well-developed and well-nourished. No distress.  overwt and well app  HENT:  Head: Normocephalic and atraumatic.  Right Ear: External ear normal.  Left Ear: External ear normal.  Mouth/Throat: Oropharynx is clear and moist.  Eyes: Pupils are equal, round, and reactive to light. Conjunctivae and EOM are normal. No scleral icterus.  Neck: Normal range of motion. Neck supple. No JVD present. Carotid bruit is not present. No thyromegaly present.  Cardiovascular: Normal rate, regular rhythm, normal heart sounds and intact distal pulses. Exam reveals no gallop.  Pulmonary/Chest: Effort normal and breath sounds normal. No respiratory distress. She has no wheezes. She exhibits no tenderness. No breast tenderness, discharge or bleeding.  Abdominal: Soft. Bowel sounds are normal. She exhibits no distension, no abdominal bruit and no mass. There is no tenderness.  Genitourinary: No breast tenderness, discharge or bleeding.  Genitourinary Comments: Breast  exam: No mass, nodules, thickening, tenderness, bulging, retraction, inflamation, nipple discharge or skin changes noted.  No axillary or clavicular LA.    Very large breast size for frame  Indentations in shoulders from bra straps   Musculoskeletal: Normal range of motion. She exhibits no edema or tenderness.  Lymphadenopathy:    She has no cervical adenopathy.  Neurological: She is alert. She has normal reflexes. No cranial nerve deficit. She exhibits normal muscle tone. Coordination normal.  Skin: Skin is warm and dry. No rash noted. No erythema. No pallor.  Solar lentigines diffusely   Psychiatric: She has a normal mood and affect.          Assessment & Plan:   Problem List Items Addressed This Visit      Other   Anxiety    Doing well with this and panic disorder with fluoxetine Has tendency to get PVCs if very nervous/ not often Will watch this  Reviewed stressors/  coping techniques/symptoms/ support sources/ tx options and side effects in detail today        Relevant Medications   FLUoxetine (PROZAC) 20 MG capsule   Hyperlipidemia    Last lipid profile was good Disc goals for lipids and reasons to control them Rev last labs with pt Rev low sat fat diet in detail Excellent hdl at 92.3 Enc healthy diet       Large breasts    Causes back and neck pain  No plans to have children Interested in breast reduction in the future  Will call for ref if needed       Obesity (BMI 30-39.9)    Discussed how this problem influences overall health and the risks it imposes  Reviewed plan for weight loss with lower calorie diet (via better food choices and also portion control or program like weight watchers) and exercise building up to or more than 30 minutes 5 days per week including some aerobic activity         Panic disorder    Well controlled with fluoxetine      Relevant Medications   FLUoxetine (PROZAC) 20 MG capsule   Routine general medical examination at a health care facility - Primary    Reviewed health habits including diet and exercise and skin cancer prevention Reviewed appropriate screening tests for age  Also reviewed health mt list, fam hx and immunization status , as well as social and family history   See HPI Labs reviewed from last year  Good health habits  Rev pap report  Enc self breast exams and regular gyn visits         Other Visit Diagnoses    Visit for TB skin test       Relevant Orders   TB Skin Test (Completed)

## 2018-05-18 LAB — TB SKIN TEST
Induration: 0 mm
TB Skin Test: NEGATIVE

## 2018-07-12 ENCOUNTER — Ambulatory Visit: Payer: Managed Care, Other (non HMO) | Admitting: Family Medicine

## 2018-07-12 ENCOUNTER — Encounter: Payer: Self-pay | Admitting: Family Medicine

## 2018-07-12 VITALS — BP 142/80 | HR 69 | Temp 98.2°F | Ht 64.0 in | Wt 200.5 lb

## 2018-07-12 DIAGNOSIS — L723 Sebaceous cyst: Secondary | ICD-10-CM

## 2018-07-12 MED ORDER — CEPHALEXIN 500 MG PO CAPS: 500.0000 mg | ORAL_CAPSULE | Freq: Three times a day (TID) | ORAL | 0 refills | Status: DC

## 2018-07-12 NOTE — Patient Instructions (Signed)
You have a mildly infected seb cyst  If you want it eventually removed- see dermatology   Keep clean with soap and water  Cover with bandage Antibiotic ointment is ok   If worse redness/swelling let us know  Take the keflex as directed

## 2018-07-12 NOTE — Progress Notes (Signed)
Subjective:    Patient ID: Natalie Lowe, female    DOB: September 14, 1981, 37 y.o.   MRN: [id number]  HPI  Here with c/o knot on L side of chest  Wt Readings from Last 3 Encounters:  07/12/18 200 lb 8 oz (90.9 kg)  05/16/18 199 lb 12 oz (90.6 kg)  02/18/16 192 lb 8 oz (87.3 kg)   34.42 kg/m   Has had a noticeable area /sub cut bump /never hurt  Now is is more raised  It is quite sore  Has not drained    Patient Active Problem List   Diagnosis Date Noted  . Sebaceous cyst 07/12/2018  . Obesity (BMI 30-39.9) 05/16/2018  . Routine general medical examination at a health care facility 05/16/2018  . Large breasts 05/16/2018  . Hyperlipidemia 02/18/2016  . Rhomboid muscle strain 03/04/2015  . Panic disorder 02/14/2014  . ASD (atrial septal defect) 12/10/2011  . Heart murmur 11/18/2011  . Family history of heart murmur 11/18/2011  . Anxiety 05/20/2011  . GERD 03/28/2010  . Allergic rhinitis 08/09/2008   Past Medical History:  Diagnosis Date  . Allergic rhinitis   . Atrial septal defect    with L->R shunt s/p transcatheter ASD closure 12/31/11  . Depression    Past Surgical History:  Procedure Laterality Date  . ASD REPAIR    . KNEE ARTHROSCOPY  2008   right  . OATES PROCEDURE  94-97   seven knee signs for torn ACL,menicusal tear  . PATENT FORAMEN OVALE CLOSURE N/A 12/31/2011   Procedure: PATENT FORAMEN OVALE CLOSURE;  Surgeon: Tonny Bollman, MD;  Location: [facility] CATH LAB;  Service: Cardiovascular;  Laterality: N/A;   Social History   Tobacco Use  . Smoking status: Former Smoker    Last attempt to quit: 11/02/2000    Years since quitting: 17.7  . Smokeless tobacco: Never Used  Substance Use Topics  . Alcohol use: Yes    Alcohol/week: 0.0 standard drinks    Comment: occasional  . Drug use: No   Family History  Problem Relation Age of Onset  . Hypertension Mother   . Cancer Maternal Aunt        breast CA   No Known Allergies Current Outpatient Medications on File  Prior to Visit  Medication Sig Dispense Refill  . aspirin 81 MG tablet Take 81 mg by mouth daily.     Marland Kitchen FLUoxetine (PROZAC) 20 MG capsule Take 1 capsule (20 mg total) by mouth daily. 90 capsule 3  . fluticasone (FLONASE) 50 MCG/ACT nasal spray Place 2 sprays into the nose daily. 48 g 3  . Glucosamine HCl 1500 MG TABS Take 1 tablet by mouth 2 (two) times daily.    Marland Kitchen loratadine (CLARITIN) 10 MG tablet Take 10 mg by mouth daily.      Lorita Officer Triphasic (ORTHO TRI-CYCLEN, 28, PO) Take 1 tablet by mouth daily.    Marland Kitchen omeprazole (PRILOSEC) 20 MG capsule Take 1 capsule (20 mg total) by mouth daily. 90 capsule 3  . TURMERIC PO Take 1 tablet by mouth 2 (two) times daily.     No current facility-administered medications on file prior to visit.     Review of Systems  Constitutional: Negative for activity change, appetite change, fatigue, fever and unexpected weight change.  HENT: Negative for congestion, ear pain, rhinorrhea, sinus pressure and sore throat.   Eyes: Negative for pain, redness and visual disturbance.  Respiratory: Negative for cough, shortness of breath and wheezing.  Cardiovascular: Negative for chest pain and palpitations.  Gastrointestinal: Negative for abdominal pain, blood in stool, constipation and diarrhea.  Endocrine: Negative for polydipsia and polyuria.  Genitourinary: Negative for dysuria, frequency and urgency.  Musculoskeletal: Negative for arthralgias, back pain and myalgias.  Skin: Negative for pallor and rash.       Cyst/lump L chest with discomfort   Allergic/Immunologic: Negative for environmental allergies.  Neurological: Negative for dizziness, syncope and headaches.  Hematological: Negative for adenopathy. Does not bruise/bleed easily.  Psychiatric/Behavioral: Negative for decreased concentration and dysphoric mood. The patient is not nervous/anxious.        Objective:   Physical Exam  Constitutional: She appears well-developed and  well-nourished. No distress.  obese and well appearing   HENT:  Head: Normocephalic and atraumatic.  Eyes: Pupils are equal, round, and reactive to light. Conjunctivae and EOM are normal. No scleral icterus.  Neck: Normal range of motion.  Cardiovascular: Normal rate, regular rhythm and normal heart sounds.  Pulmonary/Chest: Effort normal and breath sounds normal.  Lymphadenopathy:    She has no cervical adenopathy.  Neurological: She is alert. No cranial nerve deficit.  Skin: Skin is warm. There is erythema.  Infected appearing seb cyst L chest wall 1.5 cm in diameter with mild erythema and warmth  Prepped in sterile fashion/ spray anesthetic used and tip of #11 scalpel passed Caseous material expressed (minimal pus) with relief  Dressed with sterile non stick dressing and guaze  Pt tolerated well   Psychiatric: She has a normal mood and affect.          Assessment & Plan:   Problem List Items Addressed This Visit      Musculoskeletal and Integument   Sebaceous cyst - Primary    Painful seb cyst on L chest wall- opened with tip of scalpel and caseous material expressed with relief  Dressed and wound care discussed tx with keflex 500 mg tid for 7d Update if not starting to improve in a week or if worsening  (watching for inc redness or pain)  Pt aware she will need to see dermatology to have it excised in total in the future

## 2018-07-12 NOTE — Assessment & Plan Note (Signed)
Painful seb cyst on L chest wall- opened with tip of scalpel and caseous material expressed with relief  Dressed and wound care discussed tx with keflex 500 mg tid for 7d Update if not starting to improve in a week or if worsening  (watching for inc redness or pain)  Pt aware she will need to see dermatology to have it excised in total in the future

## 2018-07-18 ENCOUNTER — Telehealth: Payer: Self-pay | Admitting: Family Medicine

## 2018-07-18 DIAGNOSIS — L723 Sebaceous cyst: Secondary | ICD-10-CM

## 2018-07-18 NOTE — Telephone Encounter (Signed)
I did the referral   Marion-please note pref place - in the phone note ([facility])

## 2018-07-18 NOTE — Telephone Encounter (Signed)
Copied from CRM [id number]. Topic: Referral - Request >> Jul 18, 2018  1:20 PM Ronney LionArrington, Shykila A wrote: Reason for CRM: Patient called in requesting a referral to see a dermatologist. Patient says without a referral she would have to wait till January to be seen. Patient says [facility] @ [phone].  Preferably would like to be seen at " [facility]"  Please Advise

## 2018-07-20 NOTE — Telephone Encounter (Signed)
Referral faxed to [facility], patient notified and will call us back with Appt info.

## 2019-05-08 ENCOUNTER — Other Ambulatory Visit: Payer: Self-pay | Admitting: Family Medicine

## 2019-08-29 ENCOUNTER — Other Ambulatory Visit: Payer: Self-pay | Admitting: Family Medicine

## 2019-09-01 ENCOUNTER — Ambulatory Visit: Payer: Managed Care, Other (non HMO) | Admitting: Family Medicine

## 2019-09-01 ENCOUNTER — Other Ambulatory Visit: Payer: Self-pay

## 2019-09-01 ENCOUNTER — Encounter: Payer: Self-pay | Admitting: Family Medicine

## 2019-09-01 VITALS — BP 130/82 | HR 76 | Temp 97.7°F | Ht 64.0 in | Wt 206.4 lb

## 2019-09-01 DIAGNOSIS — K219 Gastro-esophageal reflux disease without esophagitis: Secondary | ICD-10-CM

## 2019-09-01 DIAGNOSIS — J301 Allergic rhinitis due to pollen: Secondary | ICD-10-CM | POA: Diagnosis not present

## 2019-09-01 DIAGNOSIS — F419 Anxiety disorder, unspecified: Secondary | ICD-10-CM | POA: Diagnosis not present

## 2019-09-01 DIAGNOSIS — E7800 Pure hypercholesterolemia, unspecified: Secondary | ICD-10-CM | POA: Diagnosis not present

## 2019-09-01 DIAGNOSIS — E669 Obesity, unspecified: Secondary | ICD-10-CM

## 2019-09-01 DIAGNOSIS — F410 Panic disorder [episodic paroxysmal anxiety] without agoraphobia: Secondary | ICD-10-CM

## 2019-09-01 LAB — LIPID PANEL
Cholesterol: 236 mg/dL — ABNORMAL HIGH (ref 0–200)
HDL: 82.1 mg/dL (ref >39.00)
LDL Cholesterol: 133 mg/dL — ABNORMAL HIGH (ref 0–99)
NonHDL: 153.77
Total CHOL/HDL Ratio: 3
Triglycerides: 105 mg/dL (ref 0.0–149.0)
VLDL: 21 mg/dL (ref 0.0–40.0)

## 2019-09-01 MED ORDER — FLUTICASONE PROPIONATE 50 MCG/ACT NA SUSP: NASAL | 3 refills | Status: DC

## 2019-09-01 MED ORDER — FLUOXETINE HCL 20 MG PO CAPS: ORAL_CAPSULE | ORAL | 3 refills | Status: DC

## 2019-09-01 MED ORDER — OMEPRAZOLE 20 MG PO CPDR: DELAYED_RELEASE_CAPSULE | ORAL | 3 refills | Status: DC

## 2019-09-01 NOTE — Patient Instructions (Addendum)
Cholesterol labs today   For better health Try to get most of your carbohydrates from produce (with the exception of white potatoes)  Eat less bread/pasta/rice/snack foods/cereals/sweets and other items from the middle of the grocery store (processed carbs)   Take care of yourself  Keep exercising

## 2019-09-01 NOTE — Progress Notes (Signed)
Subjective:    Patient ID: Natalie Lowe, female    DOB: 1981/01/23, 38 y.o.   MRN: [id number]  HPI Here for f/u of chronic health problems  Feeling ok  Taking care of herself  Finished her masters degree  Wt Readings from Last 3 Encounters:  09/01/19 206 lb 7 oz (93.6 kg)  07/12/18 200 lb 8 oz (90.9 kg)  05/16/18 199 lb 12 oz (90.6 kg)  back to exercising for a few month  That makes her feel better  Also walking a puppy  35.43 kg/m   Uses flonase for all rhinitis  Not bad this year   Omeprazole for GERD Works very well- mt her  If she misses a day she is symptomatic  She also watches her diet- sauces especially   H/o gen anx with panic disorder Taking fluoxetine  Had a little down time April/may- but much better now    Hyperlipidemia Lab Results  Component Value Date   CHOL 221 (H) 05/11/2017   CHOL 198 08/09/2008   Lab Results  Component Value Date   HDL 92.30 05/11/2017   HDL 69.6 08/09/2008   Lab Results  Component Value Date   LDLCALC 99 05/11/2017   LDLCALC 107 (H) 08/09/2008   Lab Results  Component Value Date   TRIG 150.0 (H) 05/11/2017   TRIG 107 08/09/2008   Lab Results  Component Value Date   CHOLHDL 2 05/11/2017   CHOLHDL 2.8 CALC 08/09/2008   No results found for: LDLDIRECT She has changed diet  More salads/veggies  Lean protein and produce   Review of Systems  Constitutional: Negative for activity change, appetite change, fatigue, fever and unexpected weight change.  HENT: Negative for congestion, ear pain, rhinorrhea, sinus pressure and sore throat.        Rhinorrhea on and off  Eyes: Negative for pain, redness and visual disturbance.  Respiratory: Negative for cough, shortness of breath and wheezing.   Cardiovascular: Negative for chest pain and palpitations.  Gastrointestinal: Negative for abdominal pain, blood in stool, constipation and diarrhea.  Endocrine: Negative for polydipsia and polyuria.  Genitourinary: Negative for  dysuria, frequency and urgency.  Musculoskeletal: Negative for arthralgias, back pain and myalgias.  Skin: Negative for pallor and rash.  Allergic/Immunologic: Negative for environmental allergies.  Neurological: Negative for dizziness, syncope and headaches.  Hematological: Negative for adenopathy. Does not bruise/bleed easily.  Psychiatric/Behavioral: Negative for decreased concentration and dysphoric mood. The patient is nervous/anxious.        Objective:   Physical Exam Constitutional:      General: She is not in acute distress.    Appearance: Normal appearance. She is well-developed. She is obese. She is not ill-appearing.  HENT:     Head: Normocephalic and atraumatic.     Right Ear: Tympanic membrane, ear canal and external ear normal.     Left Ear: Tympanic membrane, ear canal and external ear normal.     Nose: Nose normal.     Comments: Boggy nares Eyes:     General: No scleral icterus.       Right eye: No discharge.        Left eye: No discharge.     Conjunctiva/sclera: Conjunctivae normal.     Pupils: Pupils are equal, round, and reactive to light.  Neck:     Musculoskeletal: Normal range of motion and neck supple. No muscular tenderness.     Thyroid: No thyromegaly.     Vascular: No carotid bruit or JVD.  Cardiovascular:     Rate and Rhythm: Normal rate and regular rhythm.     Heart sounds: Normal heart sounds. No gallop.   Pulmonary:     Effort: Pulmonary effort is normal. No respiratory distress.     Breath sounds: Normal breath sounds. No wheezing or rales.  Abdominal:     General: Bowel sounds are normal. There is no distension or abdominal bruit.     Palpations: Abdomen is soft. There is no mass.     Tenderness: There is no abdominal tenderness.  Musculoskeletal:     Right lower leg: No edema.     Left lower leg: No edema.  Lymphadenopathy:     Cervical: No cervical adenopathy.  Skin:    General: Skin is warm and dry.     Findings: No rash.   Neurological:     Mental Status: She is alert. Mental status is at baseline.     Deep Tendon Reflexes: Reflexes are normal and symmetric.  Psychiatric:        Mood and Affect: Mood normal. Mood is not anxious or depressed.        Cognition and Memory: Cognition normal.           Assessment & Plan:   Problem List Items Addressed This Visit      Respiratory   Allergic rhinitis    Not too bad this season Continues flonase in season        Digestive   GERD    Continues omeprazole Symptomatic if she messes a day or eats the wrong food Would benefit from wt loss      Relevant Medications   omeprazole (PRILOSEC) 20 MG capsule     Other   Anxiety - Primary    Taking fluoxetine for this and panic disorder  Was worse in spring at start of pandemic Improved now Enc exercise and self care Reviewed stressors/ coping techniques/symptoms/ support sources/ tx options and side effects in detail today       Relevant Medications   FLUoxetine (PROZAC) 20 MG capsule   Panic disorder    Fluoxetine manages this well      Relevant Medications   FLUoxetine (PROZAC) 20 MG capsule   Hyperlipidemia    Disc goals for lipids and reasons to control them Rev last labs with pt Rev low sat fat diet in detail Labs today Per pt is eating better Not fasting today      Relevant Orders   Lipid panel (Completed)   Obesity (BMI 30-39.9)    Discussed how this problem influences overall health and the risks it imposes  Reviewed plan for weight loss with lower calorie diet (via better food choices and also portion control or program like weight watchers) and exercise building up to or more than 30 minutes 5 days per week including some aerobic activity

## 2019-09-03 NOTE — Assessment & Plan Note (Signed)
Discussed how this problem influences overall health and the risks it imposes  Reviewed plan for weight loss with lower calorie diet (via better food choices and also portion control or program like weight watchers) and exercise building up to or more than 30 minutes 5 days per week including some aerobic activity    

## 2019-09-03 NOTE — Assessment & Plan Note (Signed)
Continues omeprazole Symptomatic if she messes a day or eats the wrong food Would benefit from wt loss

## 2019-09-03 NOTE — Assessment & Plan Note (Signed)
Taking fluoxetine for this and panic disorder  Was worse in spring at start of pandemic Improved now Enc exercise and self care Reviewed stressors/ coping techniques/symptoms/ support sources/ tx options and side effects in detail today

## 2019-09-03 NOTE — Assessment & Plan Note (Signed)
Not too bad this season Continues flonase in season

## 2019-09-03 NOTE — Assessment & Plan Note (Signed)
Disc goals for lipids and reasons to control them Rev last labs with pt Rev low sat fat diet in detail Labs today Per pt is eating better Not fasting today

## 2019-09-03 NOTE — Assessment & Plan Note (Signed)
Fluoxetine manages this well

## 2020-09-18 ENCOUNTER — Other Ambulatory Visit: Payer: Self-pay | Admitting: Family Medicine

## 2020-11-02 ENCOUNTER — Other Ambulatory Visit: Payer: Self-pay | Admitting: Family Medicine

## 2021-02-02 ENCOUNTER — Other Ambulatory Visit: Payer: Self-pay | Admitting: Family Medicine

## 2021-02-18 ENCOUNTER — Telehealth: Payer: Self-pay | Admitting: Family Medicine

## 2021-02-18 DIAGNOSIS — Z0000 Encounter for general adult medical examination without abnormal findings: Secondary | ICD-10-CM

## 2021-02-18 DIAGNOSIS — E7800 Pure hypercholesterolemia, unspecified: Secondary | ICD-10-CM

## 2021-02-18 NOTE — Telephone Encounter (Signed)
-----   Message from Alvina Chou sent at 02/03/2021 11:42 AM EDT ----- Regarding: Lab orders for Wednesday, 4.20.22 Patient is scheduled for CPX labs, please order future labs, Thanks , Camelia Eng

## 2021-02-19 ENCOUNTER — Other Ambulatory Visit: Payer: Self-pay

## 2021-02-19 ENCOUNTER — Other Ambulatory Visit (INDEPENDENT_AMBULATORY_CARE_PROVIDER_SITE_OTHER): Payer: Managed Care, Other (non HMO)

## 2021-02-19 DIAGNOSIS — Z0000 Encounter for general adult medical examination without abnormal findings: Secondary | ICD-10-CM | POA: Diagnosis not present

## 2021-02-19 DIAGNOSIS — E7800 Pure hypercholesterolemia, unspecified: Secondary | ICD-10-CM

## 2021-02-19 LAB — CBC WITH DIFFERENTIAL/PLATELET
Basophils Absolute: 0 10*3/uL (ref 0.0–0.1)
Basophils Relative: 0.8 % (ref 0.0–3.0)
Eosinophils Absolute: 0.1 10*3/uL (ref 0.0–0.7)
Eosinophils Relative: 1.4 % (ref 0.0–5.0)
HCT: 40 % (ref 36.0–46.0)
Hemoglobin: 12.9 g/dL (ref 12.0–15.0)
Lymphocytes Relative: 25.2 % (ref 12.0–46.0)
Lymphs Abs: 1.6 10*3/uL (ref 0.7–4.0)
MCHC: 32.2 g/dL (ref 30.0–36.0)
MCV: 83.3 fl (ref 78.0–100.0)
Monocytes Absolute: 0.4 10*3/uL (ref 0.1–1.0)
Monocytes Relative: 7.2 % (ref 3.0–12.0)
Neutro Abs: 4 10*3/uL (ref 1.4–7.7)
Neutrophils Relative %: 65.4 % (ref 43.0–77.0)
Platelets: 288 10*3/uL (ref 150.0–400.0)
RBC: 4.8 Mil/uL (ref 3.87–5.11)
RDW: 14.5 % (ref 11.5–15.5)
WBC: 6.1 10*3/uL (ref 4.0–10.5)

## 2021-02-19 LAB — COMPREHENSIVE METABOLIC PANEL
ALT: 29 U/L (ref 0–35)
AST: 25 U/L (ref 0–37)
Albumin: 4.5 g/dL (ref 3.5–5.2)
Alkaline Phosphatase: 44 U/L (ref 39–117)
BUN: 16 mg/dL (ref 6–23)
CO2: 27 mEq/L (ref 19–32)
Calcium: 10.2 mg/dL (ref 8.4–10.5)
Chloride: 99 mEq/L (ref 96–112)
Creatinine, Ser: 0.87 mg/dL (ref 0.40–1.20)
GFR: 82.28 mL/min (ref >60.00)
Glucose, Bld: 85 mg/dL (ref 70–99)
Potassium: 4.8 mEq/L (ref 3.5–5.1)
Sodium: 136 mEq/L (ref 135–145)
Total Bilirubin: 0.7 mg/dL (ref 0.2–1.2)
Total Protein: 7 g/dL (ref 6.0–8.3)

## 2021-02-19 LAB — LIPID PANEL
Cholesterol: 238 mg/dL — ABNORMAL HIGH (ref 0–200)
HDL: 85.8 mg/dL (ref >39.00)
LDL Cholesterol: 125 mg/dL — ABNORMAL HIGH (ref 0–99)
NonHDL: 152.37
Total CHOL/HDL Ratio: 3
Triglycerides: 139 mg/dL (ref 0.0–149.0)
VLDL: 27.8 mg/dL (ref 0.0–40.0)

## 2021-02-19 LAB — TSH: TSH: 1.32 u[IU]/mL (ref 0.35–4.50)

## 2021-02-26 ENCOUNTER — Encounter: Payer: Self-pay | Admitting: Family Medicine

## 2021-02-26 ENCOUNTER — Ambulatory Visit (INDEPENDENT_AMBULATORY_CARE_PROVIDER_SITE_OTHER): Payer: Managed Care, Other (non HMO) | Admitting: Family Medicine

## 2021-02-26 ENCOUNTER — Other Ambulatory Visit: Payer: Self-pay

## 2021-02-26 VITALS — BP 126/72 | HR 71 | Temp 96.9°F | Ht 64.0 in | Wt 191.1 lb

## 2021-02-26 DIAGNOSIS — Z0000 Encounter for general adult medical examination without abnormal findings: Secondary | ICD-10-CM | POA: Diagnosis not present

## 2021-02-26 DIAGNOSIS — E7800 Pure hypercholesterolemia, unspecified: Secondary | ICD-10-CM

## 2021-02-26 DIAGNOSIS — E669 Obesity, unspecified: Secondary | ICD-10-CM | POA: Diagnosis not present

## 2021-02-26 DIAGNOSIS — F419 Anxiety disorder, unspecified: Secondary | ICD-10-CM | POA: Diagnosis not present

## 2021-02-26 DIAGNOSIS — F410 Panic disorder [episodic paroxysmal anxiety] without agoraphobia: Secondary | ICD-10-CM

## 2021-02-26 MED ORDER — OMEPRAZOLE 20 MG PO CPDR: 20.0000 mg | DELAYED_RELEASE_CAPSULE | Freq: Every day | ORAL | 3 refills | Status: DC

## 2021-02-26 MED ORDER — FLUOXETINE HCL 20 MG PO CAPS: ORAL_CAPSULE | ORAL | 3 refills | Status: DC

## 2021-02-26 MED ORDER — FLUTICASONE PROPIONATE 50 MCG/ACT NA SUSP: NASAL | 3 refills | Status: AC

## 2021-02-26 NOTE — Assessment & Plan Note (Signed)
Doing well with prozac 20 mg daily  Reviewed stressors/ coping techniques/symptoms/ support sources/ tx options and side effects in detail today Good coping techniques Commended on better self care

## 2021-02-26 NOTE — Assessment & Plan Note (Signed)
Disc goals for lipids and reasons to control them Rev last labs with pt Rev low sat fat diet in detail Expect improvement with better diet  Good HDL at 85.8

## 2021-02-26 NOTE — Patient Instructions (Addendum)
Don't forget to get your mammogram   Wear sun protection  Keep exercising   Good luck with the NOOM program

## 2021-02-26 NOTE — Progress Notes (Signed)
Subjective:    Patient ID: Natalie Lowe, female    DOB: 09/19/1981, 40 y.o.   MRN: [id number]  This visit occurred during the SARS-CoV-2 public health emergency.  Safety protocols were in place, including screening questions prior to the visit, additional usage of staff PPE, and extensive cleaning of exam room while observing appropriate contact time as indicated for disinfecting solutions.    HPI Here for health maintenance exam and to review chronic medical problems    Wt Readings from Last 3 Encounters:  02/26/21 191 lb 2 oz (86.7 kg)  09/01/19 206 lb 7 oz (93.6 kg)  07/12/18 200 lb 8 oz (90.9 kg)   32.81 kg/m  Feeling ok  Working on weight loss Used gym and had a trainer  Started the [employer] program 10 d ago    Pap 10/18-neg with neg HPV Sees gyn [facility]   Mammogram 06/20/19-due for one /[facility]  Self breast exam - no lumps  Had breast reduction in 2019 and happy  covid - had 2 J and J vaccines  Flu shot 10/21 Tdap 2/16  BP Readings from Last 3 Encounters:  02/26/21 126/72  09/01/19 130/82  07/12/18 (!) 142/80   Pulse Readings from Last 3 Encounters:  02/26/21 71  09/01/19 76  07/12/18 69   Hyperlipidemia Lab Results  Component Value Date   CHOL 238 (H) 02/19/2021   CHOL 236 (H) 09/01/2019   CHOL 221 (H) 05/11/2017   Lab Results  Component Value Date   HDL 85.80 02/19/2021   HDL 82.10 09/01/2019   HDL 92.30 05/11/2017   Lab Results  Component Value Date   LDLCALC 125 (H) 02/19/2021   LDLCALC 133 (H) 09/01/2019   LDLCALC 99 05/11/2017   Lab Results  Component Value Date   TRIG 139.0 02/19/2021   TRIG 105.0 09/01/2019   TRIG 150.0 (H) 05/11/2017   Lab Results  Component Value Date   CHOLHDL 3 02/19/2021   CHOLHDL 3 09/01/2019   CHOLHDL 2 05/11/2017   No results found for: LDLDIRECT  Diet is changing  Was eating fried foods/red meat    Panic disorder with anxiety  Takes prozac 20 mg daily -really helping   GERD-takes  omeprazole  Other labs  Results for orders placed or performed in visit on 02/19/21  TSH  Result Value Ref Range   TSH 1.32 0.35 - 4.50 uIU/mL  Lipid panel  Result Value Ref Range   Cholesterol 238 (H) 0 - 200 mg/dL   Triglycerides 737.1 0.0 - 149.0 mg/dL   HDL 06.26 >94.85 mg/dL   VLDL 46.2 0.0 - 70.3 mg/dL   LDL Cholesterol 500 (H) 0 - 99 mg/dL   Total CHOL/HDL Ratio 3    NonHDL 152.37   Comprehensive metabolic panel  Result Value Ref Range   Sodium 136 135 - 145 mEq/L   Potassium 4.8 3.5 - 5.1 mEq/L   Chloride 99 96 - 112 mEq/L   CO2 27 19 - 32 mEq/L   Glucose, Bld 85 70 - 99 mg/dL   BUN 16 6 - 23 mg/dL   Creatinine, Ser 9.38 0.40 - 1.20 mg/dL   Total Bilirubin 0.7 0.2 - 1.2 mg/dL   Alkaline Phosphatase 44 39 - 117 U/L   AST 25 0 - 37 U/L   ALT 29 0 - 35 U/L   Total Protein 7.0 6.0 - 8.3 g/dL   Albumin 4.5 3.5 - 5.2 g/dL   GFR 18.29 >93.71 mL/min  Calcium 10.2 8.4 - 10.5 mg/dL  CBC with Differential/Platelet  Result Value Ref Range   WBC 6.1 4.0 - 10.5 K/uL   RBC 4.80 3.87 - 5.11 Mil/uL   Hemoglobin 12.9 12.0 - 15.0 g/dL   HCT 35.5 97.4 - 16.3 %   MCV 83.3 78.0 - 100.0 fl   MCHC 32.2 30.0 - 36.0 g/dL   RDW 84.5 36.4 - 68.0 %   Platelets 288.0 150.0 - 400.0 K/uL   Neutrophils Relative % 65.4 43.0 - 77.0 %   Lymphocytes Relative 25.2 12.0 - 46.0 %   Monocytes Relative 7.2 3.0 - 12.0 %   Eosinophils Relative 1.4 0.0 - 5.0 %   Basophils Relative 0.8 0.0 - 3.0 %   Neutro Abs 4.0 1.4 - 7.7 K/uL   Lymphs Abs 1.6 0.7 - 4.0 K/uL   Monocytes Absolute 0.4 0.1 - 1.0 K/uL   Eosinophils Absolute 0.1 0.0 - 0.7 K/uL   Basophils Absolute 0.0 0.0 - 0.1 K/uL     Patient Active Problem List   Diagnosis Date Noted  . Obesity (BMI 30-39.9) 05/16/2018  . Routine general medical examination at a health care facility 05/16/2018  . Hyperlipidemia 02/18/2016  . Panic disorder 02/14/2014  . ASD (atrial septal defect) 12/10/2011  . Heart murmur 11/18/2011  . Family history of  heart murmur 11/18/2011  . Anxiety 05/20/2011  . GERD 03/28/2010  . Allergic rhinitis 08/09/2008   Past Medical History:  Diagnosis Date  . Allergic rhinitis   . Atrial septal defect    with L->R shunt s/p transcatheter ASD closure 12/31/11  . Depression    Past Surgical History:  Procedure Laterality Date  . ASD REPAIR    . KNEE ARTHROSCOPY  2008   right  . OATES PROCEDURE  94-97   seven knee signs for torn ACL,menicusal tear  . PATENT FORAMEN OVALE CLOSURE N/A 12/31/2011   Procedure: PATENT FORAMEN OVALE CLOSURE;  Surgeon: Tonny Bollman, MD;  Location: [facility] CATH LAB;  Service: Cardiovascular;  Laterality: N/A;   Social History   Tobacco Use  . Smoking status: Former Smoker    Quit date: 11/02/2000    Years since quitting: 20.3  . Smokeless tobacco: Never Used  Substance Use Topics  . Alcohol use: Yes    Alcohol/week: 0.0 standard drinks    Comment: occasional  . Drug use: No   Family History  Problem Relation Age of Onset  . Hypertension Mother   . Cancer Maternal Aunt        breast CA   Allergies  Allergen Reactions  . Neomycin-Bacitracin-Polymyxin  [Bacitracin-Neomycin-Polymyxin] Rash   Current Outpatient Medications on File Prior to Visit  Medication Sig Dispense Refill  . aspirin 81 MG tablet Take 81 mg by mouth daily.     . Calcium Carbonate-Vitamin D (CALCIUM-D PO) Take 1 capsule by mouth daily.    . Glucosamine HCl 1500 MG TABS Take 1 tablet by mouth 2 (two) times daily.    Marland Kitchen loratadine (CLARITIN) 10 MG tablet Take 10 mg by mouth daily.    Lorita Officer Triphasic (ORTHO TRI-CYCLEN, 28, PO) Take 1 tablet by mouth daily.    . TURMERIC PO Take 1 tablet by mouth 2 (two) times daily.     No current facility-administered medications on file prior to visit.    Review of Systems  Constitutional: Negative for activity change, appetite change, fatigue, fever and unexpected weight change.  HENT: Negative for congestion, ear pain, rhinorrhea, sinus pressure  and  sore throat.   Eyes: Negative for pain, redness and visual disturbance.  Respiratory: Negative for cough, shortness of breath and wheezing.   Cardiovascular: Negative for chest pain and palpitations.  Gastrointestinal: Negative for abdominal pain, blood in stool, constipation and diarrhea.  Endocrine: Negative for polydipsia and polyuria.  Genitourinary: Negative for dysuria, frequency and urgency.  Musculoskeletal: Negative for arthralgias, back pain and myalgias.  Skin: Negative for pallor and rash.  Allergic/Immunologic: Negative for environmental allergies.  Neurological: Negative for dizziness, syncope and headaches.  Hematological: Negative for adenopathy. Does not bruise/bleed easily.  Psychiatric/Behavioral: Negative for decreased concentration and dysphoric mood. The patient is not nervous/anxious.        Objective:   Physical Exam Constitutional:      General: She is not in acute distress.    Appearance: Normal appearance. She is well-developed. She is obese. She is not ill-appearing or diaphoretic.  HENT:     Head: Normocephalic and atraumatic.     Right Ear: Tympanic membrane, ear canal and external ear normal.     Left Ear: Tympanic membrane, ear canal and external ear normal.     Nose: Nose normal. No congestion.     Mouth/Throat:     Mouth: Mucous membranes are moist.     Pharynx: Oropharynx is clear. No posterior oropharyngeal erythema.  Eyes:     General: No scleral icterus.    Extraocular Movements: Extraocular movements intact.     Conjunctiva/sclera: Conjunctivae normal.     Pupils: Pupils are equal, round, and reactive to light.  Neck:     Thyroid: No thyromegaly.     Vascular: No carotid bruit or JVD.  Cardiovascular:     Rate and Rhythm: Normal rate and regular rhythm.     Pulses: Normal pulses.     Heart sounds: Murmur heard.  No gallop.   Pulmonary:     Effort: Pulmonary effort is normal. No respiratory distress.     Breath sounds: Normal  breath sounds. No wheezing.     Comments: Good air exch Chest:     Chest wall: No tenderness.  Abdominal:     General: Bowel sounds are normal. There is no distension or abdominal bruit.     Palpations: Abdomen is soft. There is no mass.     Tenderness: There is no abdominal tenderness.     Hernia: No hernia is present.  Genitourinary:    Comments: Breast and pelvic exam are done by gyn provider Musculoskeletal:        General: No tenderness. Normal range of motion.     Cervical back: Normal range of motion and neck supple. No rigidity. No muscular tenderness.     Right lower leg: No edema.     Left lower leg: No edema.  Lymphadenopathy:     Cervical: No cervical adenopathy.  Skin:    General: Skin is warm and dry.     Coloration: Skin is not pale.     Findings: No erythema or rash.     Comments: Solar lentigines diffusely   Neurological:     Mental Status: She is alert. Mental status is at baseline.     Cranial Nerves: No cranial nerve deficit.     Motor: No abnormal muscle tone.     Coordination: Coordination normal.     Gait: Gait normal.     Deep Tendon Reflexes: Reflexes are normal and symmetric. Reflexes normal.  Psychiatric:        Mood and Affect: Mood normal.  Cognition and Memory: Cognition and memory normal.           Assessment & Plan:   Problem List Items Addressed This Visit      Other   Anxiety    Doing well with prozac 20 mg daily  Reviewed stressors/ coping techniques/symptoms/ support sources/ tx options and side effects in detail today Good coping techniques Commended on better self care       Relevant Medications   FLUoxetine (PROZAC) 20 MG capsule   Panic disorder    Continue prozac 20 mg daily  Doing well with this       Relevant Medications   FLUoxetine (PROZAC) 20 MG capsule   Hyperlipidemia    Disc goals for lipids and reasons to control them Rev last labs with pt Rev low sat fat diet in detail Expect improvement with  better diet  Good HDL at 85.8       Obesity (BMI 30-39.9)    Discussed how this problem influences overall health and the risks it imposes  Reviewed plan for weight loss with lower calorie diet (via better food choices and also portion control or program like weight watchers) and exercise building up to or more than 30 minutes 5 days per week including some aerobic activity   Commended on hard work so far Now using the [employer] as well      Routine general medical examination at a health care facility - Primary    Reviewed health habits including diet and exercise and skin cancer prevention Reviewed appropriate screening tests for age  Also reviewed health mt list, fam hx and immunization status , as well as social and family history   See HPI Labs reviewed  Commended good health habits  Mammogram due-she will schedule at [facility] (scanned last rep from 2020)  Pap utd  Last gyn note reviewed

## 2021-02-26 NOTE — Assessment & Plan Note (Addendum)
Discussed how this problem influences overall health and the risks it imposes  Reviewed plan for weight loss with lower calorie diet (via better food choices and also portion control or program like weight watchers) and exercise building up to or more than 30 minutes 5 days per week including some aerobic activity   Commended on hard work so far Now using the [employer] as well

## 2021-02-26 NOTE — Assessment & Plan Note (Signed)
Continue prozac 20 mg daily  Doing well with this

## 2021-02-26 NOTE — Assessment & Plan Note (Signed)
Reviewed health habits including diet and exercise and skin cancer prevention Reviewed appropriate screening tests for age  Also reviewed health mt list, fam hx and immunization status , as well as social and family history   See HPI Labs reviewed  Commended good health habits  Mammogram due-she will schedule at [facility] (scanned last rep from 2020)  Pap utd  Last gyn note reviewed

## 2021-05-04 ENCOUNTER — Other Ambulatory Visit: Payer: Self-pay | Admitting: Family Medicine

## 2021-05-07 NOTE — Telephone Encounter (Signed)
Pt left v/m she received email notification from [facility] to contact PCP about denial of refills.I called [facility] [address] and spoke with Mae.fluoxetine and omeprazole is ready for pick up. Pt notified and voiced understanding and appreciative.pt will ck with pharmacy.,

## 2021-12-10 ENCOUNTER — Other Ambulatory Visit: Payer: Self-pay | Admitting: Family Medicine

## 2022-03-11 LAB — HM MAMMOGRAPHY

## 2022-03-19 ENCOUNTER — Encounter: Payer: Self-pay | Admitting: Family Medicine

## 2022-04-24 ENCOUNTER — Other Ambulatory Visit: Payer: Self-pay | Admitting: Family Medicine

## 2022-04-24 NOTE — Telephone Encounter (Signed)
Last ov 04/227/22 tried to call pt to make her an follow up appt lvm.

## 2022-04-24 NOTE — Telephone Encounter (Signed)
One refill given  Please follow up when able (or physical if no new problems)

## 2022-07-23 ENCOUNTER — Other Ambulatory Visit: Payer: Self-pay | Admitting: Family Medicine

## 2022-07-27 ENCOUNTER — Telehealth: Payer: Self-pay | Admitting: Family Medicine

## 2022-07-27 DIAGNOSIS — Z0000 Encounter for general adult medical examination without abnormal findings: Secondary | ICD-10-CM

## 2022-07-27 DIAGNOSIS — E7800 Pure hypercholesterolemia, unspecified: Secondary | ICD-10-CM

## 2022-07-27 NOTE — Telephone Encounter (Signed)
-----   Message from Ellamae Sia sent at 07/21/2022  2:23 PM EDT ----- Regarding: Lab orders for Tuesday, 9.26.23 Patient is scheduled for CPX labs, please order future labs, Thanks , Karna Christmas

## 2022-07-29 ENCOUNTER — Other Ambulatory Visit (INDEPENDENT_AMBULATORY_CARE_PROVIDER_SITE_OTHER): Payer: Managed Care, Other (non HMO)

## 2022-07-29 DIAGNOSIS — E7800 Pure hypercholesterolemia, unspecified: Secondary | ICD-10-CM | POA: Diagnosis not present

## 2022-07-29 DIAGNOSIS — Z0000 Encounter for general adult medical examination without abnormal findings: Secondary | ICD-10-CM

## 2022-07-29 LAB — COMPREHENSIVE METABOLIC PANEL
ALT: 18 U/L (ref 0–35)
AST: 21 U/L (ref 0–37)
Albumin: 4.6 g/dL (ref 3.5–5.2)
Alkaline Phosphatase: 45 U/L (ref 39–117)
BUN: 22 mg/dL (ref 6–23)
CO2: 29 mEq/L (ref 19–32)
Calcium: 10.2 mg/dL (ref 8.4–10.5)
Chloride: 101 mEq/L (ref 96–112)
Creatinine, Ser: 0.91 mg/dL (ref 0.40–1.20)
GFR: 77.17 mL/min (ref >60.00)
Glucose, Bld: 87 mg/dL (ref 70–99)
Potassium: 5.4 mEq/L — ABNORMAL HIGH (ref 3.5–5.1)
Sodium: 137 mEq/L (ref 135–145)
Total Bilirubin: 0.6 mg/dL (ref 0.2–1.2)
Total Protein: 6.8 g/dL (ref 6.0–8.3)

## 2022-07-29 LAB — CBC WITH DIFFERENTIAL/PLATELET
Basophils Absolute: 0 10*3/uL (ref 0.0–0.1)
Basophils Relative: 0.7 % (ref 0.0–3.0)
Eosinophils Absolute: 0.1 10*3/uL (ref 0.0–0.7)
Eosinophils Relative: 1.8 % (ref 0.0–5.0)
HCT: 39.1 % (ref 36.0–46.0)
Hemoglobin: 12.9 g/dL (ref 12.0–15.0)
Lymphocytes Relative: 25 % (ref 12.0–46.0)
Lymphs Abs: 1.2 10*3/uL (ref 0.7–4.0)
MCHC: 33 g/dL (ref 30.0–36.0)
MCV: 82.9 fl (ref 78.0–100.0)
Monocytes Absolute: 0.4 10*3/uL (ref 0.1–1.0)
Monocytes Relative: 8.8 % (ref 3.0–12.0)
Neutro Abs: 3 10*3/uL (ref 1.4–7.7)
Neutrophils Relative %: 63.7 % (ref 43.0–77.0)
Platelets: 229 10*3/uL (ref 150.0–400.0)
RBC: 4.72 Mil/uL (ref 3.87–5.11)
RDW: 14.6 % (ref 11.5–15.5)
WBC: 4.7 10*3/uL (ref 4.0–10.5)

## 2022-07-29 LAB — LIPID PANEL
Cholesterol: 229 mg/dL — ABNORMAL HIGH (ref 0–200)
HDL: 73.1 mg/dL (ref >39.00)
LDL Cholesterol: 135 mg/dL — ABNORMAL HIGH (ref 0–99)
NonHDL: 155.42
Total CHOL/HDL Ratio: 3
Triglycerides: 104 mg/dL (ref 0.0–149.0)
VLDL: 20.8 mg/dL (ref 0.0–40.0)

## 2022-07-29 LAB — TSH: TSH: 1.38 u[IU]/mL (ref 0.35–5.50)

## 2022-08-03 ENCOUNTER — Encounter: Payer: Self-pay | Admitting: Family Medicine

## 2022-08-03 ENCOUNTER — Ambulatory Visit (INDEPENDENT_AMBULATORY_CARE_PROVIDER_SITE_OTHER): Payer: Managed Care, Other (non HMO) | Admitting: Family Medicine

## 2022-08-03 VITALS — BP 110/72 | HR 81 | Temp 97.9°F | Ht 63.5 in | Wt 190.4 lb

## 2022-08-03 DIAGNOSIS — F419 Anxiety disorder, unspecified: Secondary | ICD-10-CM

## 2022-08-03 DIAGNOSIS — Z0000 Encounter for general adult medical examination without abnormal findings: Secondary | ICD-10-CM

## 2022-08-03 DIAGNOSIS — E663 Overweight: Secondary | ICD-10-CM | POA: Diagnosis not present

## 2022-08-03 DIAGNOSIS — Z23 Encounter for immunization: Secondary | ICD-10-CM | POA: Diagnosis not present

## 2022-08-03 DIAGNOSIS — E876 Hypokalemia: Secondary | ICD-10-CM | POA: Insufficient documentation

## 2022-08-03 DIAGNOSIS — E7800 Pure hypercholesterolemia, unspecified: Secondary | ICD-10-CM

## 2022-08-03 MED ORDER — NORGESTIM-ETH ESTRAD TRIPHASIC 0.18/0.215/0.25 MG-25 MCG PO TABS: 1.0000 | ORAL_TABLET | Freq: Every day | ORAL | 0 refills | Status: DC

## 2022-08-03 MED ORDER — FLUOXETINE HCL 20 MG PO CAPS: ORAL_CAPSULE | ORAL | 3 refills | Status: DC

## 2022-08-03 NOTE — Patient Instructions (Addendum)
Be sure to schedule your gyn appt   For cholesterol Avoid red meat/ fried foods/ egg yolks/ fatty breakfast meats/ butter, cheese and high fat dairy/ and shellfish    Stop at check out  Let's re check potassium in 2 weeks   Keep up the great job with diet and exercise   Keep taking the ca and D and the B12

## 2022-08-03 NOTE — Assessment & Plan Note (Signed)
Lab Results  Component Value Date   K 5.4 No hemolysis seen (H) 07/29/2022   Unsure if this was real or lab error Not taking supplements with K  Has a balanced diet  Will re check in 2 wk

## 2022-08-03 NOTE — Assessment & Plan Note (Signed)
Disc goals for lipids and reasons to control them Rev last labs with pt Rev low sat fat diet in detail Stable ratio Good HDL LDL of 135- watching this  Excellent diet and exercise

## 2022-08-03 NOTE — Assessment & Plan Note (Signed)
Despite bmi of 33 do not think pt fits criteria for obesity  Has good muscle mass from training  Very good health habits

## 2022-08-03 NOTE — Assessment & Plan Note (Signed)
Takes fluoxetine 20 mg  This works well for her  Mood is good Wants to continue it

## 2022-08-03 NOTE — Progress Notes (Signed)
Subjective:    Patient ID: Natalie Lowe, female    DOB: 05-03-1981, 41 y.o.   MRN: [id number]  HPI Here for health maintenance exam and to review chronic medical problems    Wt Readings from Last 3 Encounters:  08/03/22 190 lb 6.4 oz (86.4 kg)  02/26/21 191 lb 2 oz (86.7 kg)  09/01/19 206 lb 7 oz (93.6 kg)   33.20 kg/m  Working  Had a good summer  Vacation late in august    Immunization History  Administered Date(s) Administered   Influenza Split 08/03/2011, 08/24/2017   Influenza, Seasonal, Injecte, Preservative Fre 08/05/2016, 08/04/2017, 08/03/2018, 08/03/2019, 08/07/2020   PPD Test 05/16/2018   Tdap 12/05/2014   Health Maintenance Due  Topic Date Due   COVID-19 Vaccine (1) Never done   HIV Screening  Never done   Hepatitis C Screening  Never done   PAP SMEAR-Modifier  08/24/2020   INFLUENZA VACCINE  06/02/2022   Flu shot today    Pap 2018  Gyn care -planning soon  Due for a pap this year   Ran out of ortho tri cyclen - now periods are light but unpredicatable Wants to get back on it    Mammogram 03/2022 Self breast exam: no lumps  M aunt had breast cancer    Mood H/o anxiety  Fluoxetine 20 mg daily  Doing well/wants to stay on it for anxiety   Work stress is up and down    BP Readings from Last 3 Encounters:  08/03/22 110/72  02/26/21 126/72  09/01/19 130/82   Pulse Readings from Last 3 Encounters:  08/03/22 81  02/26/21 71  09/01/19 76    GERD- takes omeprazole 20 mg  Has not been able to get off of it  Taking B12 1000 mcg daily  Takes ca and D    Exercise- 5 days per week at least an hour  Weight lifting loves working with a trainer    Hyperlipidemia Lab Results  Component Value Date   CHOL 229 (H) 07/29/2022   CHOL 238 (H) 02/19/2021   CHOL 236 (H) 09/01/2019   Lab Results  Component Value Date   HDL 73.10 07/29/2022   HDL 85.80 02/19/2021   HDL 82.10 09/01/2019   Lab Results  Component Value Date   LDLCALC 135  (H) 07/29/2022   LDLCALC 125 (H) 02/19/2021   LDLCALC 133 (H) 09/01/2019   Lab Results  Component Value Date   TRIG 104.0 07/29/2022   TRIG 139.0 02/19/2021   TRIG 105.0 09/01/2019   Lab Results  Component Value Date   CHOLHDL 3 07/29/2022   CHOLHDL 3 02/19/2021   CHOLHDL 3 09/01/2019   No results found for: "LDLDIRECT"  Diet:  Very limited fried foods  Watches diet- lots of lean meat and limits carbs   Very little trans fat  Not much pork   Other labs Results for orders placed or performed in visit on 07/29/22  CBC with Differential/Platelet  Result Value Ref Range   WBC 4.7 4.0 - 10.5 K/uL   RBC 4.72 3.87 - 5.11 Mil/uL   Hemoglobin 12.9 12.0 - 15.0 g/dL   HCT 17.6 16.0 - 73.7 %   MCV 82.9 78.0 - 100.0 fl   MCHC 33.0 30.0 - 36.0 g/dL   RDW 10.6 26.9 - 48.5 %   Platelets 229.0 150.0 - 400.0 K/uL   Neutrophils Relative % 63.7 43.0 - 77.0 %   Lymphocytes Relative 25.0 12.0 - 46.0 %  Monocytes Relative 8.8 3.0 - 12.0 %   Eosinophils Relative 1.8 0.0 - 5.0 %   Basophils Relative 0.7 0.0 - 3.0 %   Neutro Abs 3.0 1.4 - 7.7 K/uL   Lymphs Abs 1.2 0.7 - 4.0 K/uL   Monocytes Absolute 0.4 0.1 - 1.0 K/uL   Eosinophils Absolute 0.1 0.0 - 0.7 K/uL   Basophils Absolute 0.0 0.0 - 0.1 K/uL  Comprehensive metabolic panel  Result Value Ref Range   Sodium 137 135 - 145 mEq/L   Potassium 5.4 No hemolysis seen (H) 3.5 - 5.1 mEq/L   Chloride 101 96 - 112 mEq/L   CO2 29 19 - 32 mEq/L   Glucose, Bld 87 70 - 99 mg/dL   BUN 22 6 - 23 mg/dL   Creatinine, Ser 7.85 0.40 - 1.20 mg/dL   Total Bilirubin 0.6 0.2 - 1.2 mg/dL   Alkaline Phosphatase 45 39 - 117 U/L   AST 21 0 - 37 U/L   ALT 18 0 - 35 U/L   Total Protein 6.8 6.0 - 8.3 g/dL   Albumin 4.6 3.5 - 5.2 g/dL   GFR 88.50 >27.74 mL/min   Calcium 10.2 8.4 - 10.5 mg/dL  Lipid panel  Result Value Ref Range   Cholesterol 229 (H) 0 - 200 mg/dL   Triglycerides 128.7 0.0 - 149.0 mg/dL   HDL 86.76 >72.09 mg/dL   VLDL 47.0 0.0 - 96.2  mg/dL   LDL Cholesterol 836 (H) 0 - 99 mg/dL   Total CHOL/HDL Ratio 3    NonHDL 155.42   TSH  Result Value Ref Range   TSH 1.38 0.35 - 5.50 uIU/mL     K was mildly high   Patient Active Problem List   Diagnosis Date Noted   Obesity (BMI 30-39.9) 05/16/2018   Routine general medical examination at a health care facility 05/16/2018   Hyperlipidemia 02/18/2016   Panic disorder 02/14/2014   ASD (atrial septal defect) 12/10/2011   Heart murmur 11/18/2011   Family history of heart murmur 11/18/2011   Anxiety 05/20/2011   GERD 03/28/2010   Allergic rhinitis 08/09/2008   Past Medical History:  Diagnosis Date   Allergic rhinitis    Atrial septal defect    with L->R shunt s/p transcatheter ASD closure 12/31/11   Depression    Past Surgical History:  Procedure Laterality Date   ASD REPAIR     KNEE ARTHROSCOPY  2008   right   OATES PROCEDURE  94-97   seven knee signs for torn ACL,menicusal tear   PATENT FORAMEN OVALE CLOSURE N/A 12/31/2011   Procedure: PATENT FORAMEN OVALE CLOSURE;  Surgeon: Tonny Bollman, MD;  Location: [facility] CATH LAB;  Service: Cardiovascular;  Laterality: N/A;   Social History   Tobacco Use   Smoking status: Former    Types: Cigarettes    Quit date: 11/02/2000    Years since quitting: 21.7   Smokeless tobacco: Never  Substance Use Topics   Alcohol use: Yes    Alcohol/week: 0.0 standard drinks of alcohol    Comment: occasional   Drug use: No   Family History  Problem Relation Age of Onset   Hypertension Mother    Cancer Maternal Aunt        breast CA   Allergies  Allergen Reactions   Neomycin-Bacitracin-Polymyxin  [Bacitracin-Neomycin-Polymyxin] Rash   Current Outpatient Medications on File Prior to Visit  Medication Sig Dispense Refill   aspirin 81 MG tablet Take 81 mg by mouth daily.  Calcium Carbonate-Vitamin D (CALCIUM-D PO) Take 1 capsule by mouth daily.     cyanocobalamin (VITAMIN B12) 1000 MCG tablet Take 1,000 mcg by mouth daily.      fluticasone (FLONASE) 50 MCG/ACT nasal spray Place 2 sprays into the nose daily. 48 g 3   Glucosamine HCl 1500 MG TABS Take 1 tablet by mouth 2 (two) times daily.     loratadine (CLARITIN) 10 MG tablet Take 10 mg by mouth daily.     omeprazole (PRILOSEC) 20 MG capsule TAKE 1 CAPSULE(20 MG) BY MOUTH DAILY 90 capsule 0   TURMERIC PO Take 1 tablet by mouth 2 (two) times daily.     zinc gluconate 50 MG tablet Take 50 mg by mouth daily.     No current facility-administered medications on file prior to visit.     Review of Systems  Constitutional:  Negative for activity change, appetite change, fatigue, fever and unexpected weight change.  HENT:  Negative for congestion, ear pain, rhinorrhea, sinus pressure and sore throat.   Eyes:  Negative for pain, redness and visual disturbance.  Respiratory:  Negative for cough, shortness of breath and wheezing.   Cardiovascular:  Negative for chest pain and palpitations.  Gastrointestinal:  Negative for abdominal pain, blood in stool, constipation and diarrhea.  Endocrine: Negative for polydipsia and polyuria.  Genitourinary:  Negative for dysuria, frequency and urgency.  Musculoskeletal:  Negative for arthralgias, back pain and myalgias.  Skin:  Negative for pallor and rash.  Allergic/Immunologic: Negative for environmental allergies.  Neurological:  Negative for dizziness, syncope and headaches.  Hematological:  Negative for adenopathy. Does not bruise/bleed easily.  Psychiatric/Behavioral:  Negative for decreased concentration and dysphoric mood. The patient is not nervous/anxious.        Objective:   Physical Exam Constitutional:      General: She is not in acute distress.    Appearance: Normal appearance. She is well-developed. She is obese. She is not ill-appearing or diaphoretic.     Comments: Muscular frame  Does not appear obese despite bmi   HENT:     Head: Normocephalic and atraumatic.     Right Ear: Tympanic membrane, ear canal and  external ear normal.     Left Ear: Tympanic membrane, ear canal and external ear normal.     Nose: Nose normal. No congestion.     Mouth/Throat:     Mouth: Mucous membranes are moist.     Pharynx: Oropharynx is clear. No posterior oropharyngeal erythema.  Eyes:     General: No scleral icterus.    Extraocular Movements: Extraocular movements intact.     Conjunctiva/sclera: Conjunctivae normal.     Pupils: Pupils are equal, round, and reactive to light.  Neck:     Thyroid: No thyromegaly.     Vascular: No carotid bruit or JVD.  Cardiovascular:     Rate and Rhythm: Normal rate and regular rhythm.     Pulses: Normal pulses.     Heart sounds: Normal heart sounds.     No gallop.  Pulmonary:     Effort: Pulmonary effort is normal. No respiratory distress.     Breath sounds: Normal breath sounds. No wheezing.     Comments: Good air exch Chest:     Chest wall: No tenderness.  Abdominal:     General: Bowel sounds are normal. There is no distension or abdominal bruit.     Palpations: Abdomen is soft. There is no mass.     Tenderness: There is no abdominal  tenderness.     Hernia: No hernia is present.  Genitourinary:    Comments: Gynecologist does breast and pelvic exam  Musculoskeletal:        General: No tenderness. Normal range of motion.     Cervical back: Normal range of motion and neck supple. No rigidity. No muscular tenderness.     Right lower leg: No edema.     Left lower leg: No edema.     Comments: No kyphosis   Lymphadenopathy:     Cervical: No cervical adenopathy.  Skin:    General: Skin is warm and dry.     Coloration: Skin is not pale.     Findings: No erythema or rash.     Comments: Olive complexion  Solar lentigines diffusely   Neurological:     Mental Status: She is alert. Mental status is at baseline.     Cranial Nerves: No cranial nerve deficit.     Motor: No abnormal muscle tone.     Coordination: Coordination normal.     Gait: Gait normal.     Deep  Tendon Reflexes: Reflexes are normal and symmetric. Reflexes normal.  Psychiatric:        Mood and Affect: Mood normal.        Cognition and Memory: Cognition and memory normal.     Comments: Pleasant            Assessment & Plan:   Problem List Items Addressed This Visit       Other   Anxiety    Takes fluoxetine 20 mg  This works well for her  Mood is good Wants to continue it       Relevant Medications   FLUoxetine (PROZAC) 20 MG capsule   Hyperlipidemia    Disc goals for lipids and reasons to control them Rev last labs with pt Rev low sat fat diet in detail Stable ratio Good HDL LDL of 135- watching this  Excellent diet and exercise        Hypokalemia    Lab Results  Component Value Date   K 5.4 No hemolysis seen (H) 07/29/2022  Unsure if this was real or lab error Not taking supplements with K  Has a balanced diet  Will re check in 2 wk      Over weight    Despite bmi of 33 do not think pt fits criteria for obesity  Has good muscle mass from training  Very good health habits       Routine general medical examination at a health care facility - Primary   Other Visit Diagnoses     Need for immunization against influenza       Relevant Orders   Flu Vaccine QUAD 69mo+IM (Fluarix, Fluzone & Alfiuria Quad PF) (Completed)

## 2022-08-17 ENCOUNTER — Other Ambulatory Visit (INDEPENDENT_AMBULATORY_CARE_PROVIDER_SITE_OTHER): Payer: Managed Care, Other (non HMO)

## 2022-08-17 DIAGNOSIS — E876 Hypokalemia: Secondary | ICD-10-CM | POA: Diagnosis not present

## 2022-08-17 LAB — BASIC METABOLIC PANEL
BUN: 21 mg/dL (ref 6–23)
CO2: 26 mEq/L (ref 19–32)
Calcium: 9.3 mg/dL (ref 8.4–10.5)
Chloride: 102 mEq/L (ref 96–112)
Creatinine, Ser: 0.85 mg/dL (ref 0.40–1.20)
GFR: 83.72 mL/min (ref >60.00)
Glucose, Bld: 107 mg/dL — ABNORMAL HIGH (ref 70–99)
Potassium: 4.3 mEq/L (ref 3.5–5.1)
Sodium: 137 mEq/L (ref 135–145)

## 2022-10-20 ENCOUNTER — Other Ambulatory Visit: Payer: Self-pay | Admitting: Family Medicine

## 2022-10-20 MED ORDER — NORGESTIM-ETH ESTRAD TRIPHASIC 0.18/0.215/0.25 MG-25 MCG PO TABS: 1.0000 | ORAL_TABLET | Freq: Every day | ORAL | 1 refills | Status: DC

## 2022-10-21 ENCOUNTER — Other Ambulatory Visit: Payer: Self-pay | Admitting: Family Medicine

## 2022-12-30 ENCOUNTER — Other Ambulatory Visit: Payer: Self-pay | Admitting: Family Medicine

## 2023-01-19 ENCOUNTER — Other Ambulatory Visit: Payer: Self-pay | Admitting: Family Medicine

## 2023-04-05 ENCOUNTER — Other Ambulatory Visit: Payer: Self-pay | Admitting: Family Medicine

## 2023-04-05 NOTE — Telephone Encounter (Signed)
LAST APPOINTMENT DATE: 08/03/2022- CPE   NEXT APPOINTMENT DATE: None scheduled    LAST REFILL: 10/20/2022  QTY: #90 w/ 1 refill

## 2023-07-22 ENCOUNTER — Other Ambulatory Visit: Payer: Self-pay | Admitting: Family Medicine

## 2023-07-22 NOTE — Telephone Encounter (Signed)
Pt's due for CPE (labs prior) on or after 08/05/23, please schedule and then route back to me

## 2023-08-03 ENCOUNTER — Other Ambulatory Visit: Payer: Self-pay | Admitting: Family Medicine

## 2023-08-03 NOTE — Telephone Encounter (Signed)
Due for annual exam  Schedule that please and refill until then Thanks

## 2023-09-25 ENCOUNTER — Other Ambulatory Visit: Payer: Self-pay | Admitting: Family Medicine

## 2023-09-27 NOTE — Telephone Encounter (Signed)
Refill request for TRI-LO-MILI TABLETS   LOV - 08/03/22 Next OV - not scheduled Last refill - 04/05/23 #84/1

## 2023-09-27 NOTE — Telephone Encounter (Signed)
Please refill times one and schedule an appointment, thanks

## 2023-09-28 NOTE — Telephone Encounter (Signed)
LVM for patient to c/b and schedule.

## 2023-09-28 NOTE — Telephone Encounter (Signed)
Rx sent; please call patient and schedule appt with Dr. Milinda Antis.

## 2023-10-21 ENCOUNTER — Other Ambulatory Visit: Payer: Self-pay | Admitting: Family Medicine

## 2023-10-21 NOTE — Telephone Encounter (Signed)
I refilled once to give her time to get a new provider  Thanks for the heads up

## 2023-10-21 NOTE — Telephone Encounter (Signed)
Pt is overdue for CPE (labs prior) please schedule and route back to me to refill

## 2023-10-21 NOTE — Telephone Encounter (Signed)
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

## 2023-11-02 ENCOUNTER — Other Ambulatory Visit: Payer: Self-pay | Admitting: Family Medicine
